# Patient Record
Sex: Female | Born: 1970
Health system: Southern US, Community
[De-identification: ages and names within clinical notes are randomized; demographics above are authoritative.]

## PROBLEM LIST (undated history)

## (undated) DIAGNOSIS — K8020 Calculus of gallbladder without cholecystitis without obstruction: Secondary | ICD-10-CM

## (undated) DIAGNOSIS — E785 Hyperlipidemia, unspecified: Secondary | ICD-10-CM

## (undated) DIAGNOSIS — I10 Essential (primary) hypertension: Secondary | ICD-10-CM

## (undated) DIAGNOSIS — K219 Gastro-esophageal reflux disease without esophagitis: Secondary | ICD-10-CM

## (undated) DIAGNOSIS — J302 Other seasonal allergic rhinitis: Secondary | ICD-10-CM

## (undated) DIAGNOSIS — M1990 Unspecified osteoarthritis, unspecified site: Secondary | ICD-10-CM

## (undated) HISTORY — PX: TONSILLECTOMY: SUR1361

## (undated) HISTORY — PX: TUBAL LIGATION: SHX77

## (undated) HISTORY — DX: Calculus of gallbladder without cholecystitis without obstruction: K80.20

## (undated) HISTORY — DX: Hyperlipidemia, unspecified: E78.5

## (undated) HISTORY — PX: TOTAL ABDOMINAL HYSTERECTOMY: SHX209

## (undated) HISTORY — DX: Essential (primary) hypertension: I10

---

## 1995-03-27 HISTORY — PX: CHOLECYSTECTOMY: SHX55

## 1996-10-08 ENCOUNTER — Encounter: Payer: Self-pay | Admitting: Cardiology

## 1999-09-21 ENCOUNTER — Inpatient Hospital Stay (HOSPITAL_COMMUNITY): Admission: AD | Admit: 1999-09-21 | Discharge: 1999-09-21 | Payer: Self-pay | Admitting: *Deleted

## 2000-09-05 ENCOUNTER — Emergency Department (HOSPITAL_COMMUNITY): Admission: EM | Admit: 2000-09-05 | Discharge: 2000-09-05 | Payer: Self-pay | Admitting: Emergency Medicine

## 2000-09-05 ENCOUNTER — Encounter: Payer: Self-pay | Admitting: Emergency Medicine

## 2000-10-18 ENCOUNTER — Encounter: Payer: Self-pay | Admitting: Obstetrics and Gynecology

## 2000-10-18 ENCOUNTER — Ambulatory Visit (HOSPITAL_COMMUNITY): Admission: RE | Admit: 2000-10-18 | Discharge: 2000-10-18 | Payer: Self-pay | Admitting: Obstetrics and Gynecology

## 2001-03-26 HISTORY — PX: PARTIAL HYMENECTOMY: SHX327

## 2001-06-18 ENCOUNTER — Ambulatory Visit (HOSPITAL_COMMUNITY): Admission: RE | Admit: 2001-06-18 | Discharge: 2001-06-18 | Payer: Self-pay | Admitting: Obstetrics and Gynecology

## 2001-06-18 ENCOUNTER — Encounter: Payer: Self-pay | Admitting: Obstetrics and Gynecology

## 2001-07-17 ENCOUNTER — Inpatient Hospital Stay (HOSPITAL_COMMUNITY): Admission: RE | Admit: 2001-07-17 | Discharge: 2001-07-20 | Payer: Self-pay | Admitting: Obstetrics & Gynecology

## 2003-12-07 ENCOUNTER — Ambulatory Visit (HOSPITAL_COMMUNITY): Admission: RE | Admit: 2003-12-07 | Discharge: 2003-12-07 | Payer: Self-pay | Admitting: Family Medicine

## 2004-04-24 ENCOUNTER — Ambulatory Visit: Payer: Self-pay | Admitting: Family Medicine

## 2004-06-19 ENCOUNTER — Ambulatory Visit: Payer: Self-pay | Admitting: Family Medicine

## 2005-06-04 ENCOUNTER — Ambulatory Visit: Payer: Self-pay | Admitting: Family Medicine

## 2005-08-14 ENCOUNTER — Ambulatory Visit: Payer: Self-pay | Admitting: Family Medicine

## 2005-12-11 ENCOUNTER — Ambulatory Visit: Payer: Self-pay | Admitting: Family Medicine

## 2006-01-08 ENCOUNTER — Ambulatory Visit: Payer: Self-pay | Admitting: Family Medicine

## 2006-01-31 ENCOUNTER — Ambulatory Visit: Payer: Self-pay | Admitting: Physician Assistant

## 2006-03-01 ENCOUNTER — Ambulatory Visit: Payer: Self-pay | Admitting: Family Medicine

## 2006-06-04 ENCOUNTER — Ambulatory Visit: Payer: Self-pay | Admitting: Family Medicine

## 2006-08-15 ENCOUNTER — Ambulatory Visit: Payer: Self-pay | Admitting: Family Medicine

## 2007-03-27 HISTORY — PX: LUMBAR DISC SURGERY: SHX700

## 2007-11-24 ENCOUNTER — Encounter: Admission: RE | Admit: 2007-11-24 | Discharge: 2007-11-24 | Payer: Self-pay | Admitting: Orthopedic Surgery

## 2007-12-19 ENCOUNTER — Ambulatory Visit (HOSPITAL_COMMUNITY): Admission: RE | Admit: 2007-12-19 | Discharge: 2007-12-20 | Payer: Self-pay | Admitting: Neurological Surgery

## 2008-05-05 ENCOUNTER — Encounter: Admission: RE | Admit: 2008-05-05 | Discharge: 2008-05-05 | Payer: Self-pay | Admitting: Neurological Surgery

## 2008-11-04 ENCOUNTER — Emergency Department (HOSPITAL_COMMUNITY): Admission: EM | Admit: 2008-11-04 | Discharge: 2008-11-04 | Payer: Self-pay | Admitting: Emergency Medicine

## 2009-03-26 HISTORY — PX: LUMBAR FUSION: SHX111

## 2009-05-03 ENCOUNTER — Encounter: Payer: Self-pay | Admitting: Cardiology

## 2009-07-07 ENCOUNTER — Encounter: Payer: Self-pay | Admitting: Cardiology

## 2009-08-02 ENCOUNTER — Encounter: Admission: RE | Admit: 2009-08-02 | Discharge: 2009-08-02 | Payer: Self-pay | Admitting: Neurological Surgery

## 2009-08-03 ENCOUNTER — Encounter (INDEPENDENT_AMBULATORY_CARE_PROVIDER_SITE_OTHER): Payer: Self-pay | Admitting: *Deleted

## 2009-08-03 ENCOUNTER — Ambulatory Visit: Payer: Self-pay | Admitting: Cardiology

## 2009-08-03 DIAGNOSIS — I10 Essential (primary) hypertension: Secondary | ICD-10-CM

## 2009-08-03 DIAGNOSIS — E669 Obesity, unspecified: Secondary | ICD-10-CM | POA: Insufficient documentation

## 2009-08-03 DIAGNOSIS — R072 Precordial pain: Secondary | ICD-10-CM | POA: Insufficient documentation

## 2009-08-03 DIAGNOSIS — E785 Hyperlipidemia, unspecified: Secondary | ICD-10-CM

## 2009-08-10 ENCOUNTER — Encounter: Payer: Self-pay | Admitting: Cardiology

## 2009-08-10 ENCOUNTER — Ambulatory Visit: Payer: Self-pay | Admitting: Cardiology

## 2009-08-17 ENCOUNTER — Encounter (INDEPENDENT_AMBULATORY_CARE_PROVIDER_SITE_OTHER): Payer: Self-pay | Admitting: *Deleted

## 2009-12-08 ENCOUNTER — Inpatient Hospital Stay (HOSPITAL_COMMUNITY): Admission: RE | Admit: 2009-12-08 | Discharge: 2009-12-10 | Payer: Self-pay | Admitting: Neurological Surgery

## 2009-12-08 ENCOUNTER — Ambulatory Visit: Payer: Self-pay | Admitting: Vascular Surgery

## 2010-01-10 ENCOUNTER — Encounter: Admission: RE | Admit: 2010-01-10 | Discharge: 2010-01-10 | Payer: Self-pay | Admitting: Neurological Surgery

## 2010-03-14 ENCOUNTER — Encounter
Admission: RE | Admit: 2010-03-14 | Discharge: 2010-03-14 | Payer: Self-pay | Source: Home / Self Care | Attending: Neurological Surgery | Admitting: Neurological Surgery

## 2010-04-14 ENCOUNTER — Encounter
Admission: RE | Admit: 2010-04-14 | Discharge: 2010-04-14 | Payer: Self-pay | Source: Home / Self Care | Attending: Neurological Surgery | Admitting: Neurological Surgery

## 2010-04-27 NOTE — Medication Information (Signed)
Summary: RX Folder/ MED LIST PRIMARY CARE  RX Folder/ MED LIST PRIMARY CARE   Imported By: Dorise Hiss 07/19/2009 10:51:06  _____________________________________________________________________  External Attachment:    Type:   Image     Comment:   External Document

## 2010-04-27 NOTE — Letter (Signed)
Summary: Graded Exercise Tolerance Test  [facility] [address]   [place], [place] [address]   Phone: [phone]  Fax: [phone]      [facility] Cardiovascular Services  Graded Exercise Tolerance Test    [facility]  Appointment Date:_  Appointment Time:_   Your doctor has ordered a stress test to help determine the condition of your  heart during exercise. You may take your medications the day of your test. You may eat a light meal before your test.  Please be sure to bring the copy of your order with you.   You should dress comfortably, for example: Sweat pants, shorts, or skirt, Loose                                                                                                       short sleeved T-shirt, Rubber soled lace-up shoes (tennis shoes)  You will need to arrive 15 minutes before your appointment time. You will also need to enter at the Main Entrance of the hospital and go to the registration desk. They will direct you to the Cardiovascular Department on the third floor.  You will need to plan on being at the hospital for one hour from registration for this appointment.

## 2010-04-27 NOTE — Letter (Signed)
Summary: External Correspondence/ [facility]  External Correspondence/ [facility]   Imported By: Dorise Hiss 07/19/2009 10:45:53  _____________________________________________________________________  External Attachment:    Type:   Image     Comment:   External Document

## 2010-04-27 NOTE — Letter (Signed)
Summary: Engineer, materials at [facility]  [address]   [place], [place] [address]   Phone: [phone]  Fax: [phone]        Aug 17, 2009 MRN: [id number]    [facility] [address] [place], [place]  [address]    Dear Ms. Marga Hoots,  Your test ordered by Selena Batten has been reviewed by your physician (or physician assistant) and was found to be normal or stable. Your physician (or physician assistant) felt no changes were needed at this time.  ____ Echocardiogram  __X__ Cardiac Stress Test-No further cardiac workup needed  ____ Lab Work  ____ Peripheral vascular study of arms, legs or neck  ____ CT scan or X-ray  ____ Lung or Breathing test  ____ Other:   Thank you.   Cyril Loosen, RN, BSN    Duane Boston, M.D., F.A.C.C. Thressa Sheller, M.D., F.A.C.C. Oneal Grout, M.D., F.A.C.C. Cheree Ditto, M.D., F.A.C.C. Daiva Nakayama, M.D., F.A.C.C. Kenney Houseman, M.D., F.A.C.C. Jeanne Ivan, PA-C

## 2010-04-27 NOTE — Assessment & Plan Note (Signed)
Summary: NP6- CHEST PAIN & FAMILY HX CAD   Visit Type:  Initial Consult Primary Provider:  Dr. Joette Catching  CC:  chest pain.  History of Present Illness: The patient presents for evaluation of chest discomfort. She said this happened about 3-4 weeks ago. Was an episode while driving of a left upper chest discomfort. Is also mid sternal is well. She developed tingling and numbness in her fifth and fourth fingers. It lasted for about 20 minutes. It was moderately severe. It did not radiate to her jaw. She had no return of this and has had none since then. 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 She gets fatigued with this. However, she has not been on any chest discomfort, neck or arm discomfort. She doesn't report palpitations, presyncope or syncope. She denies any PND or orthopnea. She has some mild lower extremity swelling. She has had an echocardiogram in the distant past to look for pericardial effusion. This was done in 1998 and I reviewed this and it was normal. She has otherwise had no further testing.  Preventive Screening-Counseling & Management  Alcohol-Tobacco     Smoking Status: quit  Comments: Pt quit smoking 05/2009, smoked for 20 yrs  Current Medications (verified): 1)  Amlodipine Besylate 5 Mg Tabs (Amlodipine Besylate) .... Take 1 Tablet By Mouth Once A Day 2)  Aspirin 81 Mg Tbec (Aspirin) .... Take One Tablet By Mouth Daily 3)  Amitriptyline Hcl 75 Mg Tabs (Amitriptyline Hcl) .... Take 1 Tab By Mouth At Bedtime 4)  Hydrocodone-Acetaminophen 5-500 Mg Tabs (Hydrocodone-Acetaminophen) .... Take 1 Tab By Mouth At Bedtime  Allergies (verified): No Known Drug Allergies  Comments:  Nurse/Medical Assistant: The patient's medications were reviewed with the patient and were updated in the Medication List. Pt brought a list of medications to office visit.  Cyril Loosen, RN, BSN (Aug 03, 2009 3:03 PM)  Past History:  Past Medical History: HYPERTENSION   PRECORDIAL PAIN  Past Surgical History: C-Section Cholecystectomy Microdiscectomy  Family History: Father alive MI x 2 at 70s Mother alive no  CAD Sister with "heart disease"  Social History: Alcohol Use - no Drug Use - no Married, 2 chldren Tobacco Quit 2011, 1/2 ppd for 20 years Smoking Status:  quit  Review of Systems       Mild chronic low back pain. Otherwise as stated in the history of present illness negative for all other systems.  Vital Signs:  Patient profile:   40 year old female Height:      68 inches Weight:      206.50 pounds BMI:     31.51 O2 Sat:      99 % on Room air Pulse rate:   103 / minute BP sitting:   134 / 91  (left arm) Cuff size:   regular  Vitals Entered By: Cyril Loosen, RN, BSN (Aug 03, 2009 2:59 PM)  Nutrition Counseling: Patient's BMI is greater than 25 and therefore counseled on weight management options.  O2 Flow:  Room air CC: chest pain Comments Pt states she has had 1 episode of chest pain. She has family history of CAD   Physical Exam  General:  Well developed, well nourished, in no acute distress. Head:  normocephalic and atraumatic Eyes:  PERRLA/EOM intact; conjunctiva and lids normal. Mouth:  Teeth, gums and palate normal. Oral mucosa normal. Neck:  Neck supple, no JVD. No masses, thyromegaly or abnormal cervical nodes. Chest Wall:  no deformities or breast masses noted  Lungs:  Clear bilaterally to auscultation and percussion. Abdomen:  Bowel sounds positive; abdomen soft and non-tender without masses, organomegaly, or hernias noted. No hepatosplenomegaly. Msk:  Back normal, normal gait. Muscle strength and tone normal. Extremities:  No clubbing or cyanosis. Neurologic:  Alert and oriented x 3. Skin:  Intact without lesions or rashes. Cervical Nodes:  no significant adenopathy Inguinal Nodes:  no significant adenopathy Psych:  Normal affect.   Detailed Cardiovascular Exam  Neck    Carotids: Carotids full and  equal bilaterally without bruits.      Neck Veins: Normal, no JVD.    Heart    Inspection: no deformities or lifts noted.      Palpation: normal PMI with no thrills palpable.      Auscultation: regular rate and rhythm, S1, S2 without murmurs, rubs, gallops, or clicks.    Vascular    Abdominal Aorta: no palpable masses, pulsations, or audible bruits.      Femoral Pulses: normal femoral pulses bilaterally.      Pedal Pulses: normal pedal pulses bilaterally.      Radial Pulses: normal radial pulses bilaterally.      Peripheral Circulation: no clubbing, cyanosis, or edema noted with normal capillary refill.     EKG  Procedure date:  07/07/2009  Findings:      Sinus rhythm, rate 103, axis within normal limits, intervals within normal limits, no acute ST-T wave changes.  Impression & Recommendations:  Problem # 1:  PRECORDIAL PAIN (ICD-786.51) Her chest pain is atypical. However, she has a significant family history. I will order an exercise treadmill. Orders: GXT (GXT)  Problem # 2:  HYPERTENSION (ICD-401.9) Her blood pressure is controlled and she will continue the meds as listed.  Problem # 3:  OBESITY, UNSPECIFIED (ICD-278.00) I had a long discussion with her about weight loss and exercise. I gave her specific instructions on both.  Problem # 4:  DYSLIPIDEMIA (ICD-272.4) I reviewed with her recent lipids which included an LDL of 136, HDL 42 and triglycerides 208. I suggested diet first and a statin if she cannot improve on this.  Patient Instructions: 1)  Your physician recommends that you continue on your current medications as directed. Please refer to the Current Medication list given to you today. 2)  Your physician has requested that you have an exercise tolerance test.  For further information please visit [email or web address]  Please also follow instruction sheet, as given.

## 2010-06-08 LAB — CBC
HCT: 43.3 % (ref 36.0–46.0)
Hemoglobin: 15.1 g/dL — ABNORMAL HIGH (ref 12.0–15.0)
MCH: 32.1 pg (ref 26.0–34.0)
MCHC: 34.9 g/dL (ref 30.0–36.0)
MCV: 92.1 fL (ref 78.0–100.0)
RBC: 4.7 MIL/uL (ref 3.87–5.11)

## 2010-06-08 LAB — APTT: aPTT: 26 seconds (ref 24–37)

## 2010-06-08 LAB — DIFFERENTIAL
Basophils Relative: 0 % (ref 0–1)
Monocytes Absolute: 0.4 10*3/uL (ref 0.1–1.0)
Neutrophils Relative %: 65 % (ref 43–77)

## 2010-06-08 LAB — BASIC METABOLIC PANEL
Calcium: 9.6 mg/dL (ref 8.4–10.5)
Chloride: 104 mEq/L (ref 96–112)
Glucose, Bld: 99 mg/dL (ref 70–99)
Sodium: 136 mEq/L (ref 135–145)

## 2010-06-08 LAB — PROTIME-INR: INR: 0.9 (ref 0.00–1.49)

## 2010-06-12 ENCOUNTER — Ambulatory Visit
Admission: RE | Admit: 2010-06-12 | Discharge: 2010-06-12 | Disposition: A | Discharge status: Home / Self Care | Payer: BC Managed Care – PPO | Source: Ambulatory Visit | Attending: Neurological Surgery | Admitting: Neurological Surgery

## 2010-06-12 ENCOUNTER — Other Ambulatory Visit: Payer: Self-pay | Admitting: Neurological Surgery

## 2010-06-12 DIAGNOSIS — Z09 Encounter for follow-up examination after completed treatment for conditions other than malignant neoplasm: Secondary | ICD-10-CM

## 2010-07-01 LAB — POCT I-STAT, CHEM 8
BUN: 7 mg/dL (ref 6–23)
Calcium, Ion: 1.13 mmol/L (ref 1.12–1.32)
Chloride: 109 mEq/L (ref 96–112)
Glucose, Bld: 90 mg/dL (ref 70–99)
TCO2: 21 mmol/L (ref 0–100)

## 2010-07-01 LAB — DIFFERENTIAL
Basophils Absolute: 0 10*3/uL (ref 0.0–0.1)
Basophils Relative: 1 % (ref 0–1)
Eosinophils Absolute: 0.1 10*3/uL (ref 0.0–0.7)
Monocytes Absolute: 0.3 10*3/uL (ref 0.1–1.0)
Monocytes Relative: 6 % (ref 3–12)
Neutro Abs: 2.7 10*3/uL (ref 1.7–7.7)
Neutrophils Relative %: 55 % (ref 43–77)

## 2010-07-01 LAB — POCT CARDIAC MARKERS
CKMB, poc: <1 ng/mL — ABNORMAL LOW (ref 1.0–8.0)
Myoglobin, poc: 74 ng/mL (ref 12–200)
Troponin i, poc: <0.05 ng/mL (ref 0.00–0.09)
Troponin i, poc: <0.05 ng/mL (ref 0.00–0.09)

## 2010-07-01 LAB — CBC
MCHC: 34.4 g/dL (ref 30.0–36.0)
MCV: 92.3 fL (ref 78.0–100.0)
RDW: 13.6 % (ref 11.5–15.5)

## 2010-07-01 LAB — PROTIME-INR: INR: 1 (ref 0.00–1.49)

## 2010-07-01 LAB — BRAIN NATRIURETIC PEPTIDE: Pro B Natriuretic peptide (BNP): <30 pg/mL (ref 0.0–100.0)

## 2010-07-01 LAB — APTT: aPTT: 30 seconds (ref 24–37)

## 2010-07-25 ENCOUNTER — Other Ambulatory Visit: Payer: Self-pay | Admitting: Neurological Surgery

## 2010-07-25 ENCOUNTER — Ambulatory Visit
Admission: RE | Admit: 2010-07-25 | Discharge: 2010-07-25 | Disposition: A | Discharge status: Home / Self Care | Payer: BC Managed Care – PPO | Source: Ambulatory Visit | Attending: Neurological Surgery | Admitting: Neurological Surgery

## 2010-07-25 DIAGNOSIS — IMO0002 Reserved for concepts with insufficient information to code with codable children: Secondary | ICD-10-CM

## 2010-07-25 DIAGNOSIS — M5137 Other intervertebral disc degeneration, lumbosacral region: Secondary | ICD-10-CM

## 2010-08-08 NOTE — Op Note (Signed)
[name], Clay                ACCOUNT NO.:  [id number]   MEDICAL RECORD NO.:  [id number]          PATIENT TYPE:  OIB   LOCATION:  3599                         FACILITY:  [facility]   PHYSICIAN:  Tia Alert, MD     DATE OF BIRTH:  06-17-1970   DATE OF PROCEDURE:  12/19/2007  DATE OF DISCHARGE:                               OPERATIVE REPORT   PREOPERATIVE DIAGNOSIS:  Lumbar disk herniation L5-S1 on the left with  left S1 radiculopathy.   POSTOPERATIVE DIAGNOSIS:  Lumbar disk herniation L5-S1 on the left with  left S1 radiculopathy.   PROCEDURES:  Lumbar hemilaminectomy, medial facetectomy and foraminotomy  at L5-S1 on the left, followed by microdiscectomy __________ utilizing  microscopic dissection.   SURGEON:  Tia Alert, MD   ASSISTANT:  __________.   ANESTHESIA:  General endotracheal.   COMPLICATIONS:  None apparent.   INDICATION FOR THE PROCEDURE:  [name] is a 40 year old female who  presented with severe left leg pain in the bilateral S1 distribution.  She had an MRI, which showed a small disk herniation L5-S1 on the left  with a free fragment extending caudally up under the S1 nerve root.  I  recommended a microdiscectomy at L5-S1 on the left in hopes of improving  her pain syndrome.  She had tried medical management for quite some time  without significant relief.  She understood the risks, benefits, and  expected outcome and wished to proceed.   DESCRIPTION OF THE PROCEDURE:  The patient was taken to the operating  room.  After induction of adequate generalized endotracheal anesthesia,  she was rolled into the prone position on the Wilson frame and all  pressure points were padded.  Her lumbar region was prepped with  DuraPrep, and then draped in the usual sterile fashion.  A 5 mL of local  anesthesia was injected.  A small dorsal midline incision was made and  carried down to the lumbosacral fascia.  The fascia was opened up on the  patient's left side,  and the paraspinous musculature was taken down in a  subperiosteal fashion to expose the L5-S1 on the left.  Intraoperative x-  ray confirmed that level, and then used the combination of the high-  speed drill and Kerrison punches to perform the hemilaminectomy, medial  facetectomy, and foraminotomy at L5-S1 on the left side.   The underlying yellow ligament was opened and removed in a piecemeal  fashion to expose the underlying dura and S1 nerve root.  The S1 nerve  root was retracted medially, and the epidural venous vasculature was  coagulated and opened.  This exposed a small free fragment sitting up  under the left S1 nerve root just caudal to the disk space.  This was  removed with a nerve hook and pituitary rongeurs.  I then incised the  disk space and performed a thorough intradiscal discectomy.  Once this  was done, I palpated with a nerve hook into the midline and into the  foramen, which showed adequate decompression of the nerve root, was free  and pulsatile, irrigated with  saline solution containing Bacitracin  allowing the DuraGen to help prevent epidural fibrosis, and then closed  the fascia with 0 Vicryl, closed the subcutaneous and subcuticular  tissue with 2-0 and 3-0 Vicryl, and closed the skin with Benzoin  and Steri-Strips.  The drapes were removed.  A sterile dressing was  applied.  The patient was awakened from general anesthesia, and  transported to the recovery room in stable condition.  At the end of the  procedure, all sponge, needle, and instrument counts were correct.      Tia Alert, MD  Electronically Signed     DSJ/MEDQ  D:  12/19/2007  T:  12/20/2007  Job:  [id number]

## 2010-08-11 NOTE — Op Note (Signed)
[facility]  Patient:    Jamie Clay, Jamie Clay Visit Number: [id number] MRN: [id number]          Service Type: MED Location: 4A A427 [id number] Attending Physician:  Lazaro Arms Dictated by:   Turner Daniels, M.D. Proc. Date: 07/17/01 Admit Date:  07/17/2001                             Operative Report  PREOPERATIVE DIAGNOSES: 1. Dysmenorrhea, severe. 2. Hypermenorrhea. 3. Nausea, vomiting, diarrhea, back and leg pain associated with menses. 4. Bicornuate uterus.  POSTOPERATIVE DIAGNOSES: 1. Dysmenorrhea, severe. 2. Hypermenorrhea. 3. Nausea, vomiting, diarrhea, back and leg pain associated with menses. 4. Bicornuate uterus. 5. Minimal endometriosis.  PROCEDURES: 1. Abdominal hysterectomy. 2. Peritoneal biopsy of endometriosis and ablation of endometriosis.  SURGEON: Turner Daniels, M.D.  ANESTHESIA: General endotracheal.  FINDINGS: The patient had an obvious bicornuate uterus.  Both ovaries and tubes appeared to be normal.  There was no endometriosis on the ovaries. There was a very small simple cyst on the left ovary.  She had stigmata of ovulation on that ovary.  There was some just gunpowder lesions of the right posterior cul-de-sac, which were excised sharply and sent to pathology for evaluation.  There was some on the left, and all of these were ablated with electrocautery unit. The upper abdomen, liver, gallbladder, and kidneys were all normal. There were no other peritoneal abnormalities.  DESCRIPTION OF OPERATION: The patient was taken to the operating room and placed in a supine position where she underwent general endotracheal anesthesia.  She was prepped and draped in the usual sterile fashion.  A Foley catheter was placed, and her vagina was prepped.  A Pfannenstiel skin incision was made and carried down sharply through the rectus fascia which was scored in the midline and extended laterally.  The fascia was taken off the muscle superiorly and  inferiorly without difficulty. The muscles were divided, and the peritoneal cavity was entered.  A Balfour self-retaining retractor was placed, and the upper abdomen was packed away, and the patient was placed in Trendelenburg position.  The uterine cornu were grasped, and the left round ligament was suture ligated and cut. The vesicouterine serosal flap on the left was created.  An avascular window in the utero-ovarian ligament was made, and it was clamped, cut, and double suture ligated with good hemostasis. The uterine vessels on the left were skeletonized.  The bladder flap was created on the right, and the right round ligament was suture ligated and cut.  An avascular window was identified in the utero-ovarian ligament on the right. It was clamped, cut, and double suture ligated with good hemostasis.  The right uterine vessels were skeletonized. The bladder was pushed off the lower uterine segment bluntly.  Both uterine vessels were clamped, cut, and suture ligated with good hemostasis.  A Heaney clamp was used down the cervix through the cardinal ligament, each pedicle being clamped, cut, and transfixion suture ligated.  I reached the vagina.  The vagina was cross clamped.  The vagina was incised, and the specimen was removed. Vaginal angle sutures were placed bilaterally, and a single figure-of-eight suture was placed for vaginal closure and hemostasis.  The pelvis was irrigated using warm normal saline. The endometriosis in the uterosacral ligaments were identified.  A peritoneal biopsy was taken on the right just to confirm this was ablated, as well as the lesions in the left uterosacral.  There  was minimal endometriosis.  There was no other peritoneal or ovarian endometriosis seen.  A culdoplasty suture was placed by plicating the uterosacral ligaments, and the cardinal ligament sutures were tied together for additional vaginal bladder support and bladder support.  Again, the  pelvis was irrigated, and all pedicles were found to be hemostatic.  The ovaries were attached to the round ligaments bilaterally with 3-0 Vicryl.  The Balfour self-retaining retractor was removed.  The packs were removed.  The subcutaneous tissue and the muscles were irrigated and made hemostatic.  The muscles were reapproximated loosely using 0 chromic.  The fascia was closed using 0 Vicryl running.  The subcutaneous tissue was irrigated and made hemostatic, and the skin was closed using skin staples. The patient tolerated the procedure well.  She experienced 150 cc of blood loss and was taken to the recovery room in a good, stable condition.  All counts were correct x3. Dictated by:   Turner Daniels, M.D. Attending Physician:  Lazaro Arms DD:  07/17/01 TD:  07/17/01 Job: [id number]

## 2010-08-11 NOTE — Discharge Summary (Signed)
[facility]  Patient:    Jamie Clay, Jamie Clay Visit Number: [id number] MRN: [id number]          Service Type: MED Location: 4A [id number] 01 Attending Physician:  Lazaro Arms Dictated by:   Turner Daniels, M.D. Admit Date:  07/17/2001 Discharge Date: 07/20/2001                             Discharge Summary  DISCHARGE DIAGNOSES: 1. Status post abdominal hysterectomy. 2. Unremarkable postoperative course.  PROCEDURE:  Abdominal hysterectomy.  HISTORY OF PRESENT ILLNESS:  Please refer to the transcribed history and physical and the operative note for details of admission to the hospital.  HOSPITAL COURSE:  Patient was admitted postoperatively.  She had an unremarkable course.  She had a hemoglobin and hematocrit on postoperative day #1 of 11.5 and 32.2 and on postoperative day #3 was 10.8 and 31.8 with a white count of 4400.  Her bowel function returned somewhat slowly.  She required Dulcolax suppositories to begin passing gas.  She did not have a bowel movement prior to discharge.  Her abdomen was flat and had normal bowel sounds and was passing flatus.  She also had some bladder spasm that required Urecholine.  She tolerated progression in her diet.  She tolerated ambulation. She did well with oral pain medicine and as a result was discharged to home on the morning of postoperative day #3 in good, stable condition to follow up in the office on Wednesday, April 30 at 10 a.m. to have her staples removed.  She was given a prescription for Tylox, Bextra 20 mg, Phenergan 25 mg, and Urecholine 25 mg half tablet p.r.n. for bladder spasm.  She will be seen back in the office as I said, on Wednesday and she is given instructions/precautions for contacting the office prior to that time. Dictated by:   Turner Daniels, M.D. Attending Physician:  Lazaro Arms DD:  07/20/01 TD:  07/21/01 Job: [id number]

## 2010-08-11 NOTE — H&P (Signed)
[facility]  Patient:    Jamie Clay, Jamie Clay Visit Number: [id number] MRN: [id number]          Service Type: OUT Location: RAD Attending Physician:  Tilda Burrow Dictated by:   Duane Lope, M.D. Admit Date:  06/18/2001 Discharge Date: 06/18/2001                           History and Physical  PREOPERATIVE HISTORY AND PHYSICAL  HISTORY OF PRESENT ILLNESS:  The patient is a 40 year old white female, gravida 2, para 2, status post tubal ligation, currently on her menses, who is admitted for abdominal hysterectomy.  The patient had a bicornuate uterus which is of some interest as it relates to her other symptoms.  The patient has had severe dysmenorrhea and hypermenorrhea really her whole life. Currently in the last several years she has taken oral contraceptive pills and nonsteroidals without any effect.  Currently she takes Darvocet, Phenergan and 800 mg of Motrin 3-4 times a day and it does not help.  She has diarrhea, nausea and vomiting, pain all down her legs and pain in her back and nothing really helps.  She is basically bedridden for several days with her menses. She has been on conservative therapy in the past and, of course, has not responded.  As a result, she is admitted for an abdominal hysterectomy.  PAST MEDICAL HISTORY:  Negative.  PAST SURGICAL HISTORY: 1. Two C-sections with a tubal. 2. Tonsils and adenoids. 3. Laparoscopic cholecystectomy. 4. Laparoscopic lysis of adhesions.  PAST OBSTETRICAL HISTORY:  Two C-sections and two pregnancies.  MEDICINES:  None.  ALLERGIES:  None.  REVIEW OF SYSTEMS:  Noncontributory.  SOCIAL HISTORY:  She smokes a half a pack of cigarettes a day.  PHYSICAL EXAMINATION:  VITAL SIGNS:  Her weight is 157 pounds, blood pressure is 110/80.  HEENT:  Unremarkable, with normal thyroid.  LUNGS:  Clear.  HEART:  Regular rate and rhythm without murmur, regurgitation or gallop.  BREASTS:  Without  mass, discharge, skin changes.  ABDOMEN:  Benign, no hepatosplenomegaly and no masses, nontender, no guarding.  GENITALIA:  She has normal external genitalia.  The vagina is pink and moist without discharge.  Cervix is nulliparous, without lesions.  Uterus is normal size, shape, contour.  No cervical motion tenderness, mildly tender to palpation, on her menses.  Adnexa is negative.  EXTREMITIES:  Warm with no edema.  NEUROLOGICAL EXAM:  Grossly intact.  IMPRESSION: 1. Severe dysmenorrhea, primary. 2. Hypermenorrhea. 3. Bicornuate uterus.  PLAN:  The patient has failed all conservative therapy.  She wants to proceed with definitive management and, as a result, she is admitted for an abdominal hysterectomy.  She understands the risks, benefits, indications, alternatives and will proceed. Dictated by:   Duane Lope, M.D. Attending Physician:  Tilda Burrow DD:  07/16/01 TD:  07/16/01 Job: [id number]

## 2010-11-06 ENCOUNTER — Other Ambulatory Visit: Payer: Self-pay | Admitting: Neurological Surgery

## 2010-11-06 ENCOUNTER — Ambulatory Visit
Admission: RE | Admit: 2010-11-06 | Discharge: 2010-11-06 | Disposition: A | Discharge status: Home / Self Care | Payer: BC Managed Care – PPO | Source: Ambulatory Visit | Attending: Neurological Surgery | Admitting: Neurological Surgery

## 2010-11-06 DIAGNOSIS — M545 Low back pain: Secondary | ICD-10-CM

## 2010-11-06 DIAGNOSIS — M5137 Other intervertebral disc degeneration, lumbosacral region: Secondary | ICD-10-CM

## 2010-12-25 LAB — DIFFERENTIAL
Basophils Absolute: 0
Eosinophils Relative: 2
Lymphocytes Relative: 35
Lymphs Abs: 1.5
Neutro Abs: 2.5
Neutrophils Relative %: 56

## 2010-12-25 LAB — CBC
HCT: 42.3
Platelets: 204
RDW: 13.2
WBC: 4.4

## 2010-12-25 LAB — BASIC METABOLIC PANEL
BUN: 6
Calcium: 9.4
Creatinine, Ser: 1
GFR calc non Af Amer: >60
Glucose, Bld: 99

## 2010-12-25 LAB — PROTIME-INR
INR: 1
Prothrombin Time: 13

## 2011-10-23 ENCOUNTER — Encounter: Payer: Self-pay | Admitting: Cardiology

## 2012-08-13 IMAGING — CR DG CHEST 2V
2 series · 2 of 2 positions shown · non-contrast
Comparison: 11/04/2008

CLINICAL DATA: Lumbar spondylosis. Pre-op respiratory exam.

CHEST - 2 VIEW

[view not recorded (1 of 2)]
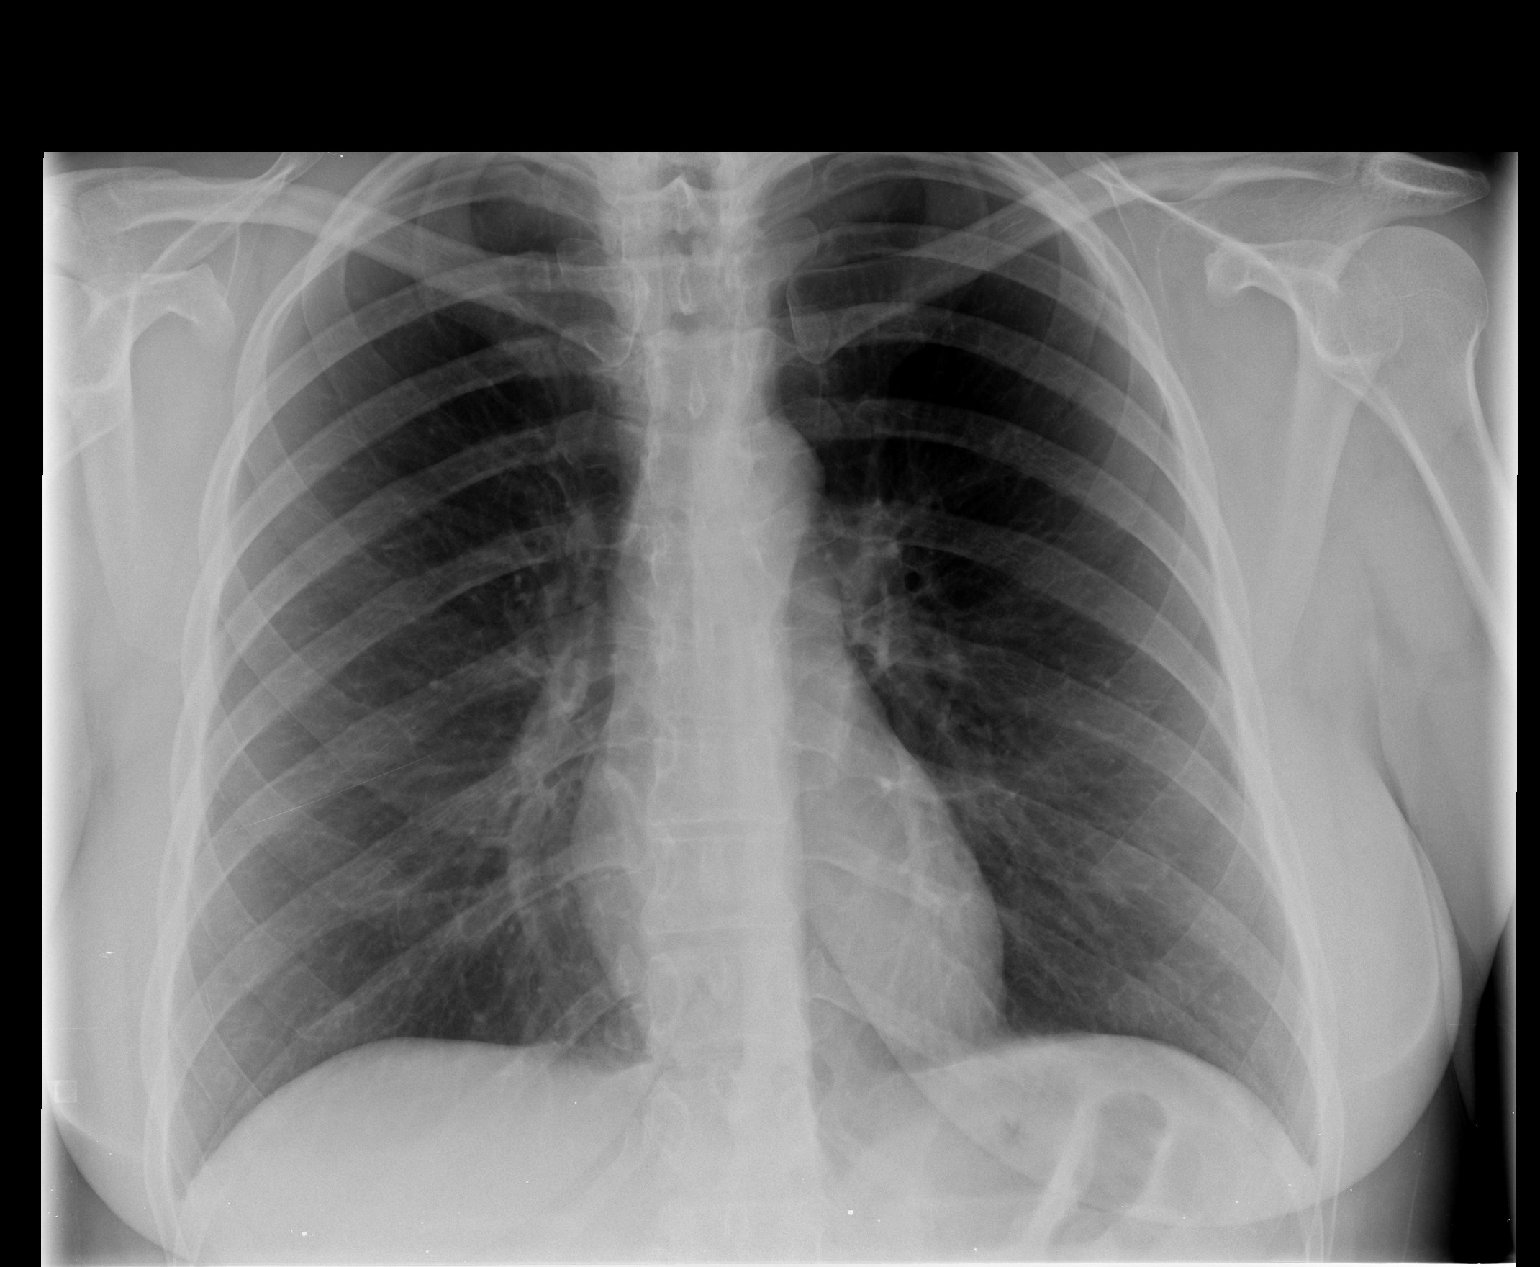

[view not recorded (2 of 2)]
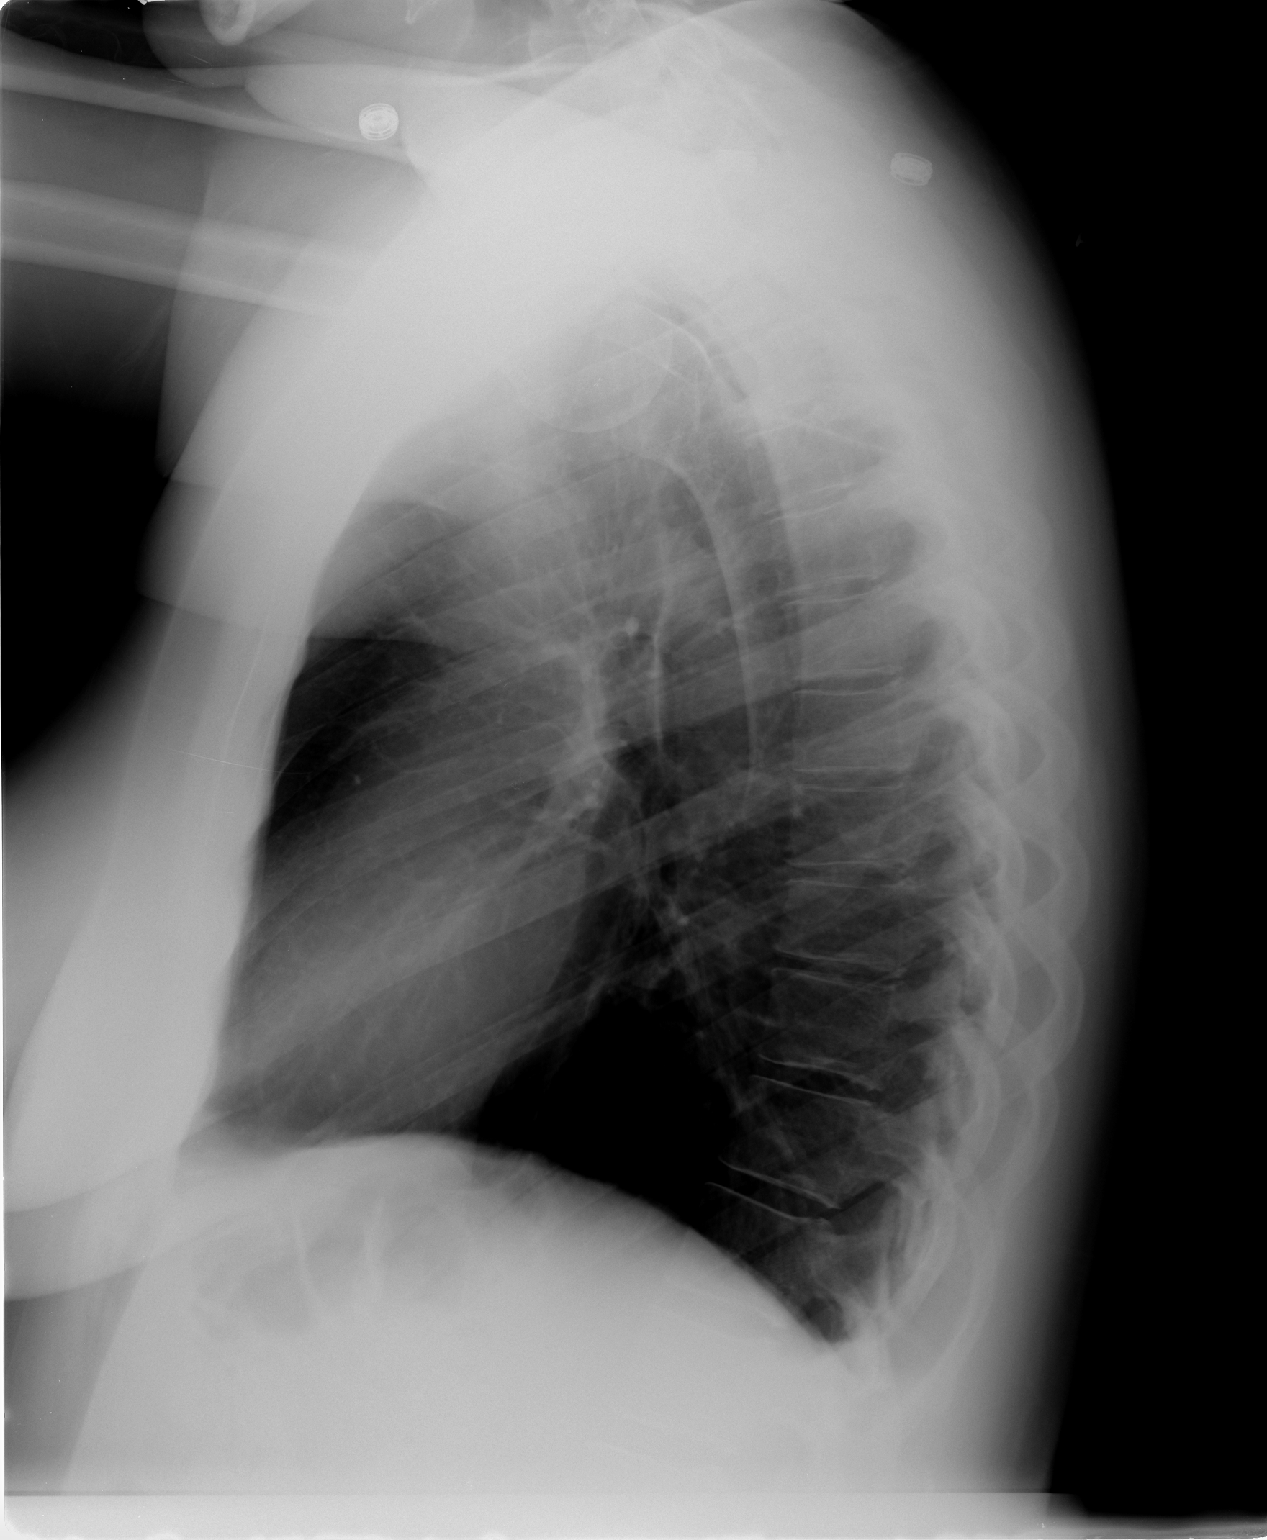

[2 of 2 positions shown; findings below may reference images not displayed]

FINDINGS: The heart size and mediastinal contours are within
normal limits.  Both lungs are clear.  The visualized skeletal
structures are unremarkable.
IMPRESSION: No active cardiopulmonary disease.

## 2013-02-21 IMAGING — CR DG LUMBAR SPINE 2-3V
3 series · 3 of 3 positions shown · non-contrast
Comparison: 03/14/2010 and earlier

CLINICAL DATA: Left leg pain - post lumbar fusion

LUMBAR SPINE - 2-3 VIEW

[t l-spine a.p.]
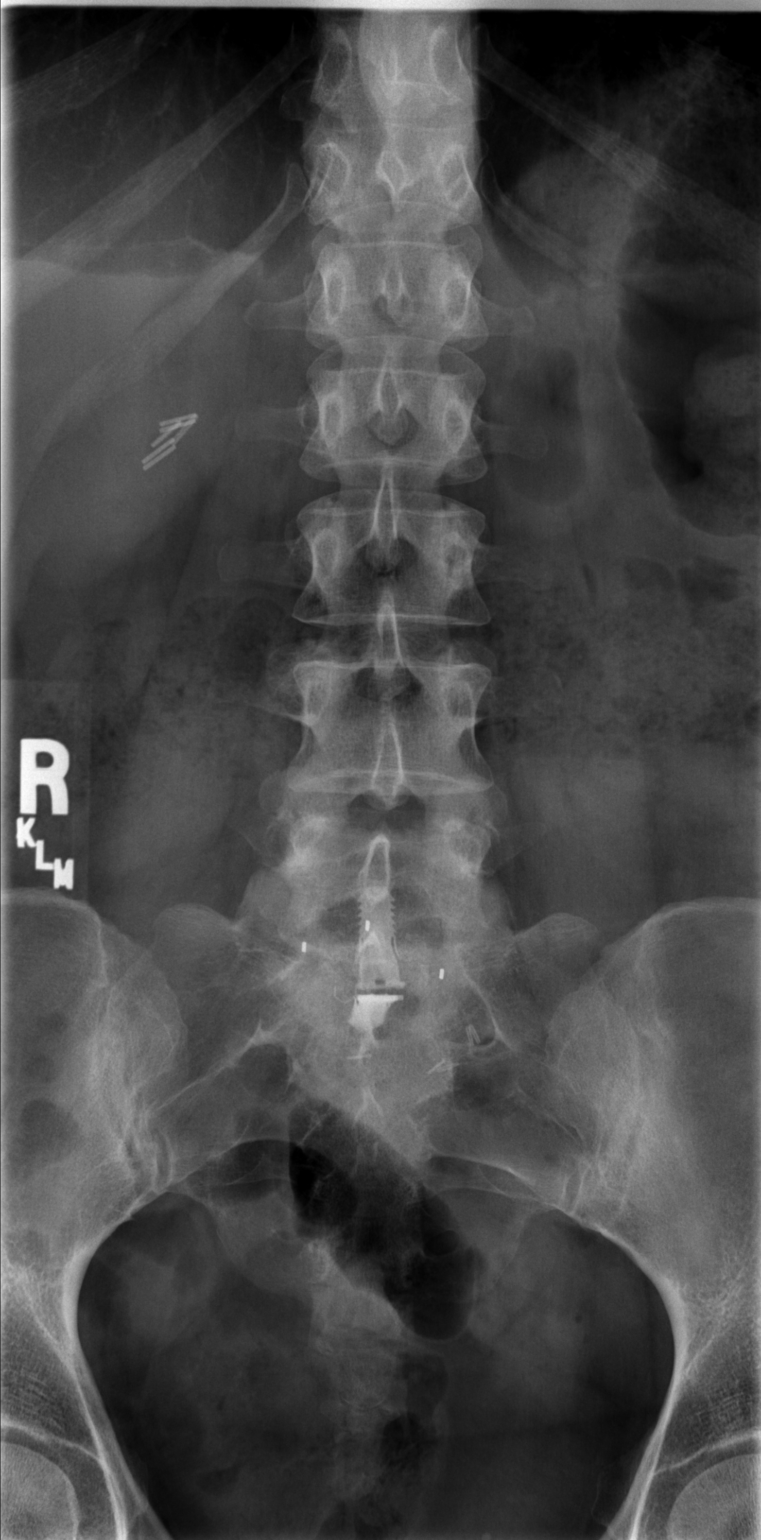

[t l-spine lat]
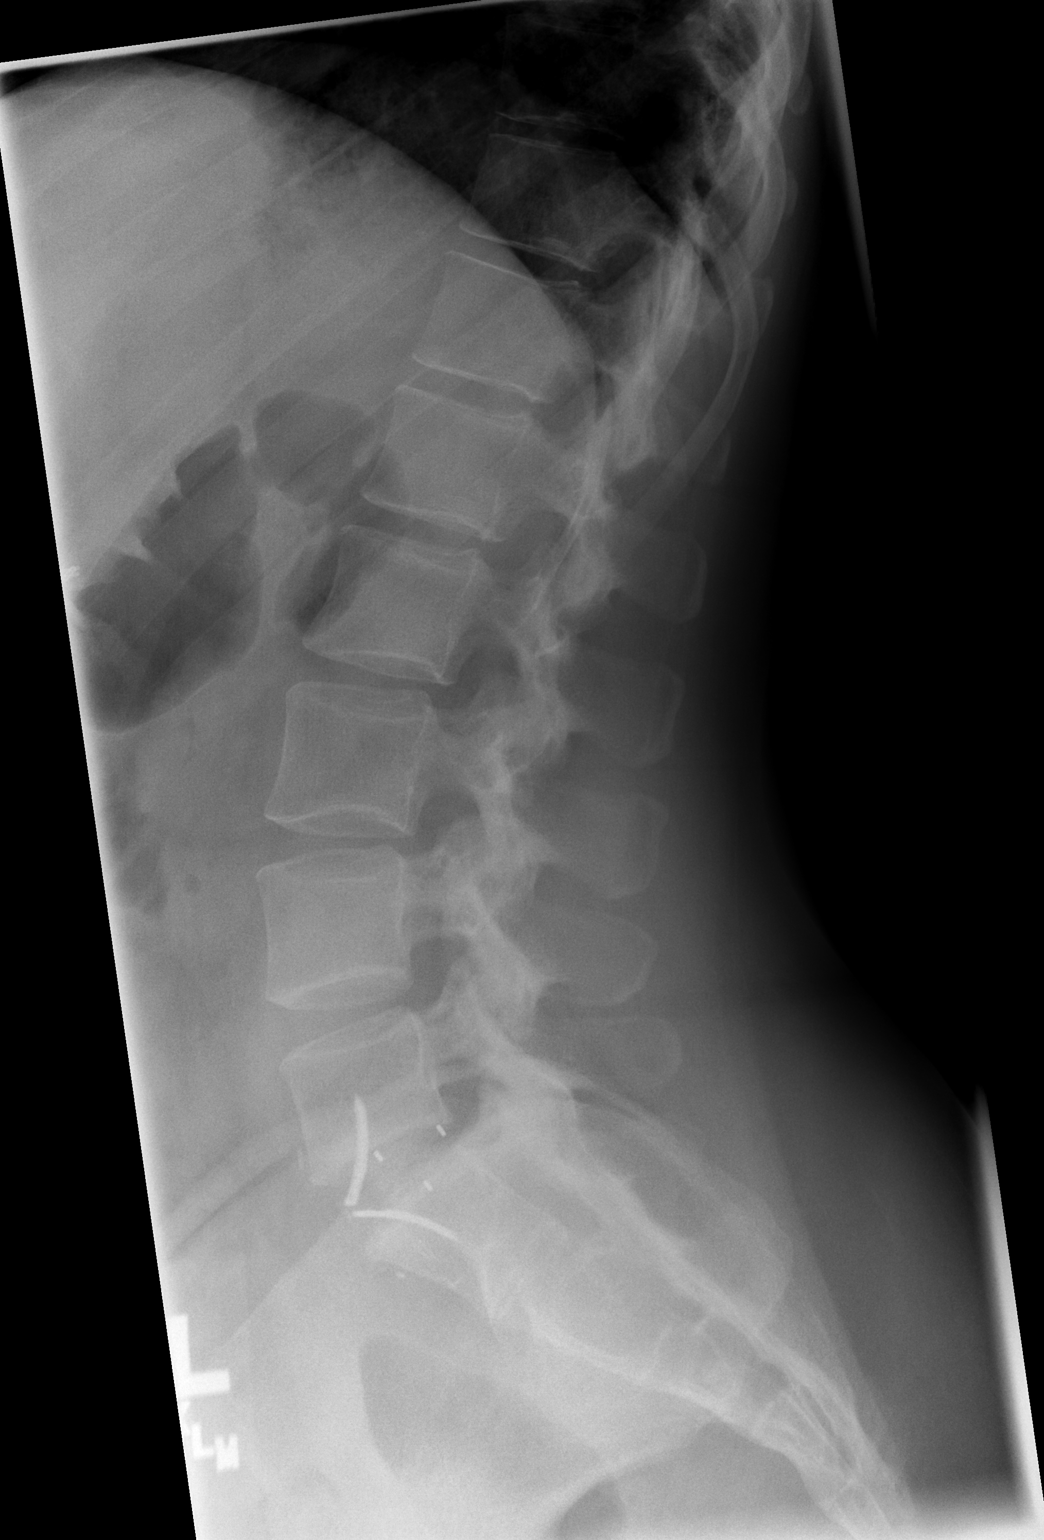

[t l-spine l5-s1 spot]
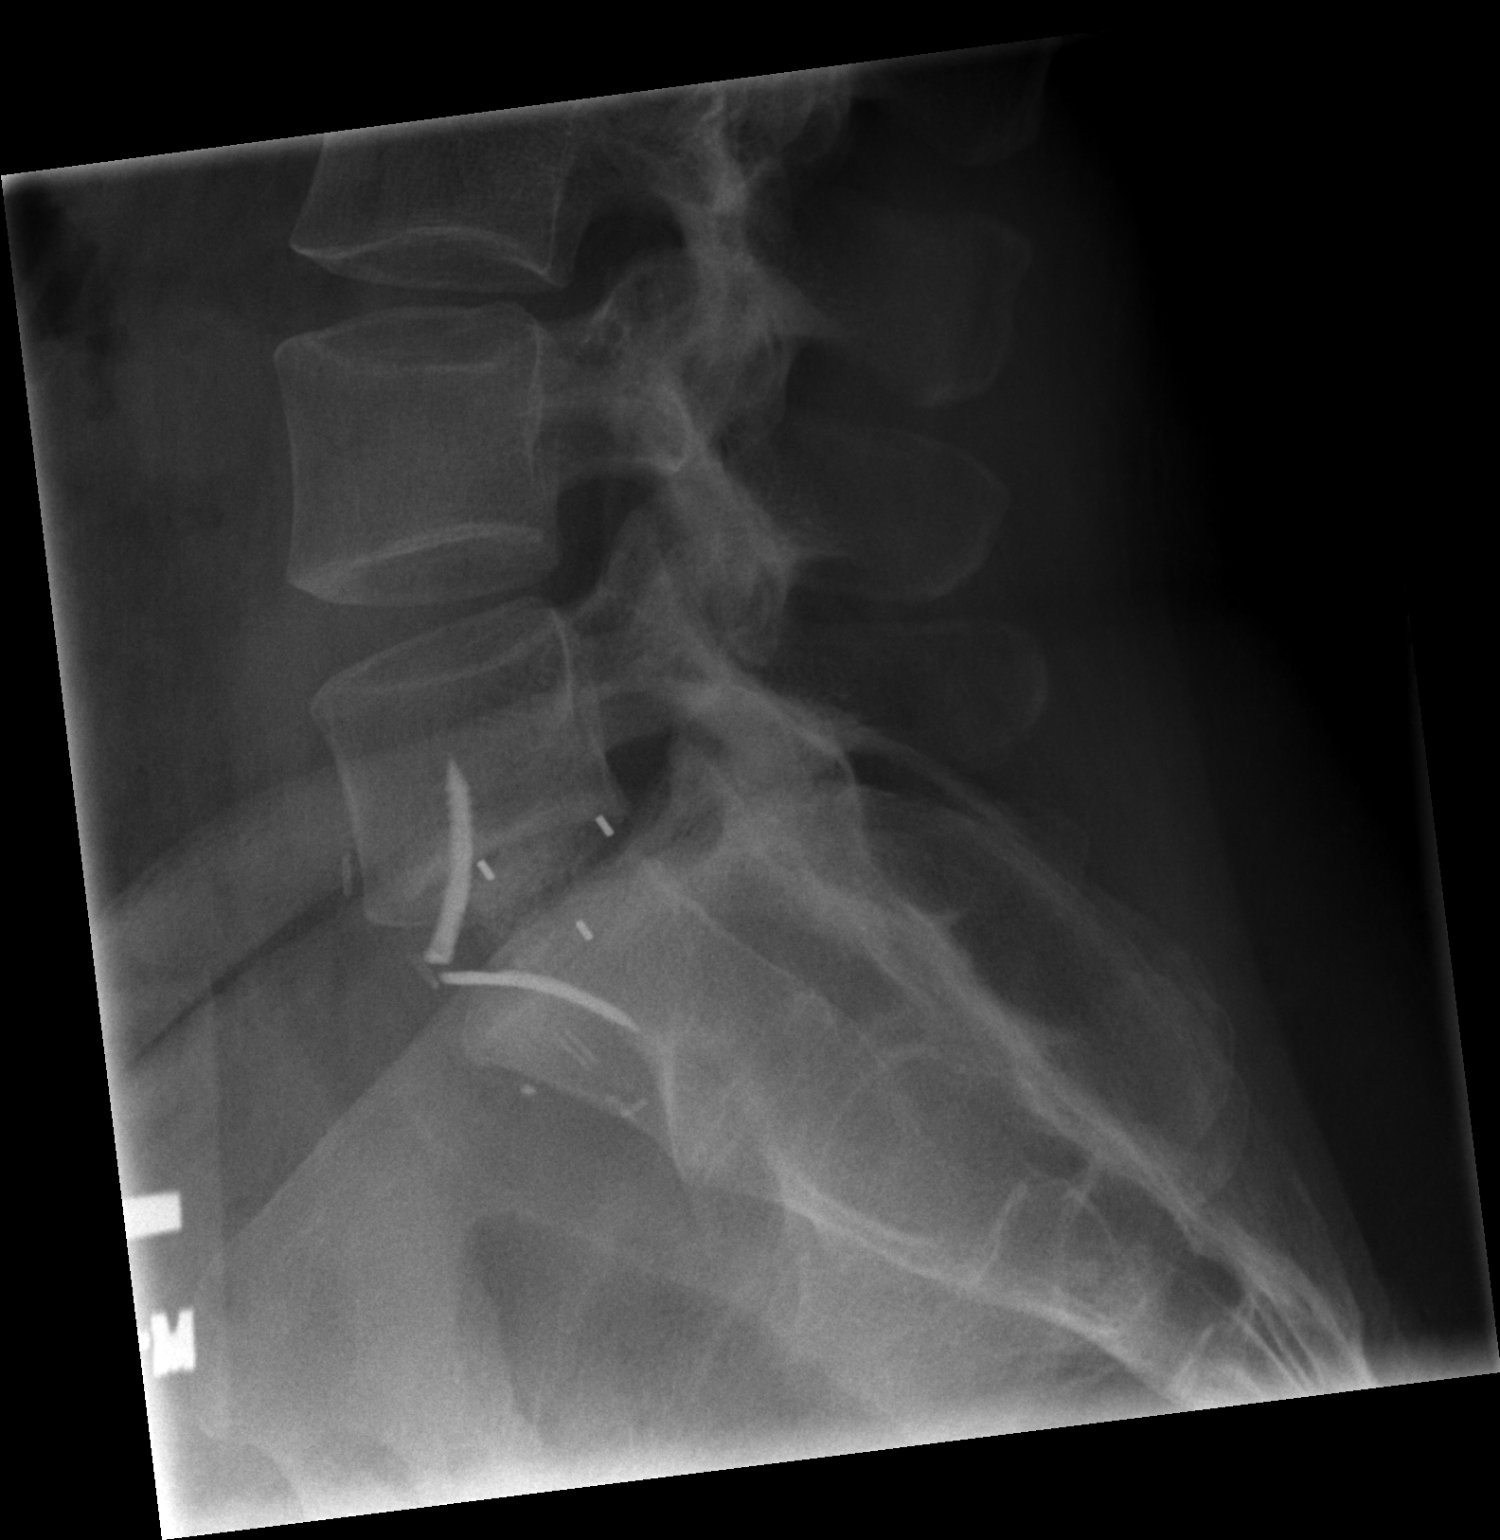

[3 of 3 positions shown; findings below may reference images not displayed]

FINDINGS: Post L5-S1 anterior interbody fusion.  Good position and
alignment of bony elements and hardware.  Disc height at other
levels remains normal.  No other acute or significant findings.
IMPRESSION: Post L5-S1 anterior interbody fusion.  No acute findings or
interval change.

## 2014-04-08 ENCOUNTER — Other Ambulatory Visit: Payer: Self-pay | Admitting: Orthopedic Surgery

## 2014-04-22 ENCOUNTER — Encounter (HOSPITAL_BASED_OUTPATIENT_CLINIC_OR_DEPARTMENT_OTHER): Payer: Self-pay | Admitting: *Deleted

## 2014-04-22 NOTE — Progress Notes (Signed)
To come in for ekg-bmet 

## 2014-04-26 ENCOUNTER — Encounter (HOSPITAL_BASED_OUTPATIENT_CLINIC_OR_DEPARTMENT_OTHER)
Admission: RE | Admit: 2014-04-26 | Discharge: 2014-04-26 | Disposition: A | Discharge status: Home / Self Care | Payer: 59 | Source: Ambulatory Visit | Attending: Orthopedic Surgery | Admitting: Orthopedic Surgery

## 2014-04-26 ENCOUNTER — Other Ambulatory Visit: Payer: Self-pay

## 2014-04-26 DIAGNOSIS — Z9049 Acquired absence of other specified parts of digestive tract: Secondary | ICD-10-CM | POA: Diagnosis not present

## 2014-04-26 DIAGNOSIS — M1811 Unilateral primary osteoarthritis of first carpometacarpal joint, right hand: Secondary | ICD-10-CM | POA: Diagnosis not present

## 2014-04-26 DIAGNOSIS — F17210 Nicotine dependence, cigarettes, uncomplicated: Secondary | ICD-10-CM | POA: Diagnosis not present

## 2014-04-26 DIAGNOSIS — I10 Essential (primary) hypertension: Secondary | ICD-10-CM | POA: Diagnosis not present

## 2014-04-26 DIAGNOSIS — K219 Gastro-esophageal reflux disease without esophagitis: Secondary | ICD-10-CM | POA: Diagnosis not present

## 2014-04-26 DIAGNOSIS — Z90710 Acquired absence of both cervix and uterus: Secondary | ICD-10-CM | POA: Diagnosis not present

## 2014-04-26 DIAGNOSIS — Z9851 Tubal ligation status: Secondary | ICD-10-CM | POA: Diagnosis not present

## 2014-04-26 LAB — BASIC METABOLIC PANEL
Anion gap: 6 (ref 5–15)
BUN: 9 mg/dL (ref 6–23)
CALCIUM: 8.8 mg/dL (ref 8.4–10.5)
CO2: 25 mmol/L (ref 19–32)
CREATININE: 0.96 mg/dL (ref 0.50–1.10)
Chloride: 106 mmol/L (ref 96–112)
GFR calc non Af Amer: 71 mL/min — ABNORMAL LOW (ref >90)
GFR, EST AFRICAN AMERICAN: 82 mL/min — AB (ref >90)
GLUCOSE: 102 mg/dL — AB (ref 70–99)
POTASSIUM: 3.8 mmol/L (ref 3.5–5.1)
SODIUM: 137 mmol/L (ref 135–145)

## 2014-04-27 ENCOUNTER — Ambulatory Visit (HOSPITAL_BASED_OUTPATIENT_CLINIC_OR_DEPARTMENT_OTHER)
Admission: RE | Admit: 2014-04-27 | Discharge: 2014-04-27 | Disposition: A | Discharge status: Home / Self Care | Payer: 59 | Source: Ambulatory Visit | Attending: Orthopedic Surgery | Admitting: Orthopedic Surgery

## 2014-04-27 ENCOUNTER — Ambulatory Visit (HOSPITAL_BASED_OUTPATIENT_CLINIC_OR_DEPARTMENT_OTHER): Payer: 59 | Admitting: Anesthesiology

## 2014-04-27 ENCOUNTER — Encounter (HOSPITAL_BASED_OUTPATIENT_CLINIC_OR_DEPARTMENT_OTHER)
Admission: RE | Disposition: A | Discharge status: Home / Self Care | Payer: 59 | Source: Ambulatory Visit | Attending: Orthopedic Surgery

## 2014-04-27 ENCOUNTER — Encounter (HOSPITAL_BASED_OUTPATIENT_CLINIC_OR_DEPARTMENT_OTHER): Payer: Self-pay

## 2014-04-27 DIAGNOSIS — M1811 Unilateral primary osteoarthritis of first carpometacarpal joint, right hand: Secondary | ICD-10-CM | POA: Insufficient documentation

## 2014-04-27 DIAGNOSIS — F17210 Nicotine dependence, cigarettes, uncomplicated: Secondary | ICD-10-CM | POA: Insufficient documentation

## 2014-04-27 DIAGNOSIS — Z9851 Tubal ligation status: Secondary | ICD-10-CM | POA: Insufficient documentation

## 2014-04-27 DIAGNOSIS — I10 Essential (primary) hypertension: Secondary | ICD-10-CM | POA: Insufficient documentation

## 2014-04-27 DIAGNOSIS — K219 Gastro-esophageal reflux disease without esophagitis: Secondary | ICD-10-CM | POA: Insufficient documentation

## 2014-04-27 DIAGNOSIS — Z90710 Acquired absence of both cervix and uterus: Secondary | ICD-10-CM | POA: Insufficient documentation

## 2014-04-27 DIAGNOSIS — Z9049 Acquired absence of other specified parts of digestive tract: Secondary | ICD-10-CM | POA: Insufficient documentation

## 2014-04-27 HISTORY — DX: Other seasonal allergic rhinitis: J30.2

## 2014-04-27 HISTORY — DX: Unspecified osteoarthritis, unspecified site: M19.90

## 2014-04-27 HISTORY — PX: CARPOMETACARPEL SUSPENSION PLASTY: SHX5005

## 2014-04-27 HISTORY — DX: Gastro-esophageal reflux disease without esophagitis: K21.9

## 2014-04-27 LAB — POCT HEMOGLOBIN-HEMACUE: Hemoglobin: 15.1 g/dL — ABNORMAL HIGH (ref 12.0–15.0)

## 2014-04-27 SURGERY — CARPOMETACARPEL (CMC) SUSPENSION PLASTY
Anesthesia: General | Site: Thumb | Laterality: Right

## 2014-04-27 MED ORDER — LIDOCAINE HCL (CARDIAC) 20 MG/ML IV SOLN
INTRAVENOUS | Status: DC | PRN
  Administered 2014-04-27: 80 mg via INTRAVENOUS

## 2014-04-27 MED ORDER — FENTANYL CITRATE 0.05 MG/ML IJ SOLN
INTRAMUSCULAR | Status: AC
  Filled 2014-04-27: qty 2

## 2014-04-27 MED ORDER — ONDANSETRON HCL 4 MG/2ML IJ SOLN
INTRAMUSCULAR | Status: DC | PRN
  Administered 2014-04-27: 4 mg via INTRAVENOUS

## 2014-04-27 MED ORDER — FENTANYL CITRATE 0.05 MG/ML IJ SOLN
INTRAMUSCULAR | Status: DC | PRN
  Administered 2014-04-27: 50 ug via INTRAVENOUS

## 2014-04-27 MED ORDER — OXYCODONE HCL 5 MG PO TABS: 5.0000 mg | ORAL_TABLET | Freq: Once | ORAL | Status: DC | PRN

## 2014-04-27 MED ORDER — MIDAZOLAM HCL 2 MG/2ML IJ SOLN
1.0000 mg | INTRAMUSCULAR | Status: DC | PRN
  Administered 2014-04-27: 2 mg via INTRAVENOUS

## 2014-04-27 MED ORDER — LACTATED RINGERS IV SOLN
INTRAVENOUS | Status: DC
  Administered 2014-04-27 (×2): via INTRAVENOUS

## 2014-04-27 MED ORDER — CHLORHEXIDINE GLUCONATE 4 % EX LIQD: 60.0000 mL | Freq: Once | CUTANEOUS | Status: DC

## 2014-04-27 MED ORDER — DEXAMETHASONE SODIUM PHOSPHATE 10 MG/ML IJ SOLN
INTRAMUSCULAR | Status: DC | PRN
  Administered 2014-04-27: 10 mg via INTRAVENOUS

## 2014-04-27 MED ORDER — MIDAZOLAM HCL 2 MG/2ML IJ SOLN
INTRAMUSCULAR | Status: AC
  Filled 2014-04-27: qty 2

## 2014-04-27 MED ORDER — BUPIVACAINE-EPINEPHRINE (PF) 0.5% -1:200000 IJ SOLN
INTRAMUSCULAR | Status: DC | PRN
  Administered 2014-04-27: 25 mL via PERINEURAL

## 2014-04-27 MED ORDER — ONDANSETRON HCL 4 MG/2ML IJ SOLN: 4.0000 mg | Freq: Once | INTRAMUSCULAR | Status: DC | PRN

## 2014-04-27 MED ORDER — PROPOFOL 10 MG/ML IV BOLUS
INTRAVENOUS | Status: DC | PRN
  Administered 2014-04-27: 200 mg via INTRAVENOUS

## 2014-04-27 MED ORDER — OXYCODONE-ACETAMINOPHEN 5-325 MG PO TABS: ORAL_TABLET | ORAL | Status: DC

## 2014-04-27 MED ORDER — CEFAZOLIN SODIUM-DEXTROSE 2-3 GM-% IV SOLR
2.0000 g | INTRAVENOUS | Status: AC
  Administered 2014-04-27: 2 g via INTRAVENOUS

## 2014-04-27 MED ORDER — FENTANYL CITRATE 0.05 MG/ML IJ SOLN
50.0000 ug | INTRAMUSCULAR | Status: DC | PRN
  Administered 2014-04-27: 100 ug via INTRAVENOUS

## 2014-04-27 MED ORDER — OXYCODONE HCL 5 MG/5ML PO SOLN: 5.0000 mg | Freq: Once | ORAL | Status: DC | PRN

## 2014-04-27 MED ORDER — HYDROMORPHONE HCL 1 MG/ML IJ SOLN: 0.2500 mg | INTRAMUSCULAR | Status: DC | PRN

## 2014-04-27 MED ORDER — CEFAZOLIN SODIUM-DEXTROSE 2-3 GM-% IV SOLR
INTRAVENOUS | Status: AC
  Filled 2014-04-27: qty 50

## 2014-04-27 SURGICAL SUPPLY — 75 items
BANDAGE ELASTIC 3 VELCRO ST LF (GAUZE/BANDAGES/DRESSINGS) ×2 IMPLANT
BLADE MINI RND TIP GREEN BEAV (BLADE) ×2 IMPLANT
BLADE SURG 15 STRL LF DISP TIS (BLADE) ×2 IMPLANT
BLADE SURG 15 STRL SS (BLADE) ×6
BNDG CMPR 9X4 STRL LF SNTH (GAUZE/BANDAGES/DRESSINGS) ×1
BNDG CMPR MD 5X2 ELC HKLP STRL (GAUZE/BANDAGES/DRESSINGS)
BNDG ELASTIC 2 VLCR STRL LF (GAUZE/BANDAGES/DRESSINGS) IMPLANT
BNDG ESMARK 4X9 LF (GAUZE/BANDAGES/DRESSINGS) ×3 IMPLANT
BNDG GAUZE ELAST 4 BULKY (GAUZE/BANDAGES/DRESSINGS) ×3 IMPLANT
CHLORAPREP W/TINT 26ML (MISCELLANEOUS) ×3 IMPLANT
CORDS BIPOLAR (ELECTRODE) ×3 IMPLANT
COVER BACK TABLE 60X90IN (DRAPES) ×3 IMPLANT
COVER MAYO STAND STRL (DRAPES) ×3 IMPLANT
CUFF TOURNIQUET SINGLE 18IN (TOURNIQUET CUFF) ×3 IMPLANT
DECANTER SPIKE VIAL GLASS SM (MISCELLANEOUS) IMPLANT
DRAPE EXTREMITY T 121X128X90 (DRAPE) ×3 IMPLANT
DRAPE OEC MINIVIEW 54X84 (DRAPES) ×2 IMPLANT
DRAPE SURG 17X23 STRL (DRAPES) ×3 IMPLANT
DRSG PAD ABDOMINAL 8X10 ST (GAUZE/BANDAGES/DRESSINGS) IMPLANT
GAUZE SPONGE 4X4 12PLY STRL (GAUZE/BANDAGES/DRESSINGS) ×3 IMPLANT
GAUZE XEROFORM 1X8 LF (GAUZE/BANDAGES/DRESSINGS) ×3 IMPLANT
GLOVE BIO SURGEON STRL SZ7.5 (GLOVE) ×3 IMPLANT
GLOVE BIOGEL PI IND STRL 7.0 (GLOVE) IMPLANT
GLOVE BIOGEL PI IND STRL 8 (GLOVE) ×1 IMPLANT
GLOVE BIOGEL PI IND STRL 8.5 (GLOVE) IMPLANT
GLOVE BIOGEL PI INDICATOR 7.0 (GLOVE) ×2
GLOVE BIOGEL PI INDICATOR 8 (GLOVE) ×2
GLOVE BIOGEL PI INDICATOR 8.5 (GLOVE) ×2
GLOVE ECLIPSE 6.5 STRL STRAW (GLOVE) ×2 IMPLANT
GLOVE EXAM NITRILE LRG STRL (GLOVE) ×2 IMPLANT
GLOVE SURG ORTHO 8.0 STRL STRW (GLOVE) ×2 IMPLANT
GOWN STRL REUS W/ TWL LRG LVL3 (GOWN DISPOSABLE) ×1 IMPLANT
GOWN STRL REUS W/TWL LRG LVL3 (GOWN DISPOSABLE) ×3
GOWN STRL REUS W/TWL XL LVL3 (GOWN DISPOSABLE) ×5 IMPLANT
K-WIRE .035X4 (WIRE) IMPLANT
NDL HYPO 25X1 1.5 SAFETY (NEEDLE) IMPLANT
NDL KEITH (NEEDLE) IMPLANT
NDL SAFETY ECLIPSE 18X1.5 (NEEDLE) IMPLANT
NEEDLE HYPO 18GX1.5 SHARP (NEEDLE)
NEEDLE HYPO 22GX1.5 SAFETY (NEEDLE) IMPLANT
NEEDLE HYPO 25X1 1.5 SAFETY (NEEDLE) IMPLANT
NEEDLE KEITH (NEEDLE) IMPLANT
NS IRRIG 1000ML POUR BTL (IV SOLUTION) ×3 IMPLANT
PACK BASIN DAY SURGERY FS (CUSTOM PROCEDURE TRAY) ×3 IMPLANT
PAD CAST 3X4 CTTN HI CHSV (CAST SUPPLIES) ×1 IMPLANT
PAD CAST 4YDX4 CTTN HI CHSV (CAST SUPPLIES) IMPLANT
PADDING CAST ABS 4INX4YD NS (CAST SUPPLIES) ×2
PADDING CAST ABS COTTON 4X4 ST (CAST SUPPLIES) ×1 IMPLANT
PADDING CAST COTTON 3X4 STRL (CAST SUPPLIES) ×3
PADDING CAST COTTON 4X4 STRL (CAST SUPPLIES)
PASSER SUT SWANSON 36MM LOOP (INSTRUMENTS) IMPLANT
SLEEVE SCD COMPRESS KNEE MED (MISCELLANEOUS) ×2 IMPLANT
SLING ARM FOAM STRAP LRG (SOFTGOODS) ×2 IMPLANT
SPLINT FAST PLASTER 5X30 (CAST SUPPLIES)
SPLINT PLASTER CAST FAST 5X30 (CAST SUPPLIES) IMPLANT
SPLINT PLASTER CAST XFAST 3X15 (CAST SUPPLIES) IMPLANT
SPLINT PLASTER CAST XFAST 4X15 (CAST SUPPLIES) IMPLANT
SPLINT PLASTER XTRA FAST SET 4 (CAST SUPPLIES)
SPLINT PLASTER XTRA FASTSET 3X (CAST SUPPLIES) ×40
STOCKINETTE 4X48 STRL (DRAPES) ×3 IMPLANT
SUT ETHIBOND 3-0 V-5 (SUTURE) ×4 IMPLANT
SUT ETHILON 3 0 PS 1 (SUTURE) IMPLANT
SUT ETHILON 4 0 PS 2 18 (SUTURE) ×2 IMPLANT
SUT FIBERWIRE 2-0 18 17.9 3/8 (SUTURE)
SUT MERSILENE 2.0 SH NDLE (SUTURE) IMPLANT
SUT MERSILENE 4 0 P 3 (SUTURE) IMPLANT
SUT SILK 4 0 PS 2 (SUTURE) IMPLANT
SUT STEEL 4 0 (SUTURE) ×2 IMPLANT
SUT VIC AB 0 SH 27 (SUTURE) IMPLANT
SUT VICRYL 4-0 PS2 18IN ABS (SUTURE) ×2 IMPLANT
SUTURE FIBERWR 2-0 18 17.9 3/8 (SUTURE) IMPLANT
SYR BULB 3OZ (MISCELLANEOUS) ×3 IMPLANT
SYR CONTROL 10ML LL (SYRINGE) IMPLANT
TOWEL OR 17X24 6PK STRL BLUE (TOWEL DISPOSABLE) ×6 IMPLANT
UNDERPAD 30X30 INCONTINENT (UNDERPADS AND DIAPERS) ×3 IMPLANT

## 2014-04-27 NOTE — Anesthesia Preprocedure Evaluation (Signed)
Anesthesia Evaluation  Patient identified by MRN, date of birth, ID band Patient awake    Reviewed: Allergy & Precautions, NPO status , Patient's Chart, lab work & pertinent test results  Airway Mallampati: I  TM Distance: >3 FB Neck ROM: Full    Dental  (+) Teeth Intact, Dental Advisory Given   Pulmonary Current Smoker,  breath sounds clear to auscultation        Cardiovascular hypertension, Pt. on medications Rhythm:Regular Rate:Normal     Neuro/Psych    GI/Hepatic GERD-  Medicated and Controlled,  Endo/Other    Renal/GU      Musculoskeletal   Abdominal   Peds  Hematology   Anesthesia Other Findings   Reproductive/Obstetrics                             Anesthesia Physical Anesthesia Plan  ASA: II  Anesthesia Plan: General   Post-op Pain Management:    Induction: Intravenous  Airway Management Planned: LMA  Additional Equipment:   Intra-op Plan:   Post-operative Plan: Extubation in OR  Informed Consent: I have reviewed the patients History and Physical, chart, labs and discussed the procedure including the risks, benefits and alternatives for the proposed anesthesia with the patient or authorized representative who has indicated his/her understanding and acceptance.   Dental advisory given  Plan Discussed with: CRNA, Anesthesiologist and Surgeon  Anesthesia Plan Comments:         Anesthesia Quick Evaluation  

## 2014-04-27 NOTE — Anesthesia Postprocedure Evaluation (Signed)
  Anesthesia Post-op Note  Patient: Jamie Clay  Procedure(s) Performed: Procedure(s): RIGHT THUMB CARPOMETACARPAL ARTHROPLASTY  [name]  (Right)  Patient Location: PACU  Anesthesia Type: General with regional   Level of Consciousness: awake, alert  and oriented  Airway and Oxygen Therapy: Patient Spontanous Breathing  Post-op Pain:  None  Post-op Assessment: Post-op Vital signs reviewed  Post-op Vital Signs: Reviewed  Last Vitals:  Filed Vitals:   04/27/14 1445  BP: 115/69  Pulse: 73  Temp:   Resp: 13    Complications: No apparent anesthesia complications

## 2014-04-27 NOTE — Transfer of Care (Signed)
Immediate Anesthesia Transfer of Care Note  Patient: Jamie Clay  Procedure(s) Performed: Procedure(s): RIGHT THUMB CARPOMETACARPAL ARTHROPLASTY  [name]  (Right)  Patient Location: PACU  Anesthesia Type:GA combined with regional for post-op pain  Level of Consciousness: sedated  Airway & Oxygen Therapy: Patient Spontanous Breathing and Patient connected to face mask oxygen  Post-op Assessment: Report given to RN and Post -op Vital signs reviewed and stable  Post vital signs: Reviewed and stable  Last Vitals:  Filed Vitals:   04/27/14 1405  BP:   Pulse: 96  Temp:   Resp:     Complications: No apparent anesthesia complications

## 2014-04-27 NOTE — Op Note (Signed)
[id number] 

## 2014-04-27 NOTE — Op Note (Signed)
Intra-operative fluoroscopic images in the AP, lateral, and oblique views were taken and evaluated by myself.  Reduction and hardware placement were confirmed.  There was no intraarticular penetration of permanent hardware.  

## 2014-04-27 NOTE — H&P (Signed)
  Jamie Clay is an 44 y.o. female.   Chief Complaint: right thumb cmc osteoarthritis HPI: 44 yo rhd female with right thumb cmc pain x 2 years.  Has tried splinting and injections without relief.  She wishes to have a cmc arthroplasty for management of symptoms.  Past Medical History  Diagnosis Date  . Hypertension   . Arthritis   . Seasonal allergies   . GERD (gastroesophageal reflux disease)     Past Surgical History  Procedure Laterality Date  . Cesarean section  02,95  . Tonsillectomy    . Cholecystectomy  1997  . Tubal ligation    . Abdominal hysterectomy  2003  . Lumbar disc surgery  2009  . Lumbar fusion  2011    Family History  Problem Relation Age of Onset  . Heart attack Father 63    X2  . Heart disease Sister    Social History:  reports that she has been smoking.  She does not have any smokeless tobacco history on file. She reports that she drinks alcohol. She reports that she does not use illicit drugs.  Allergies: No Known Allergies  No prescriptions prior to admission    Results for orders placed or performed during the hospital encounter of 04/27/14 (from the past 48 hour(s))  Basic metabolic panel     Status: Abnormal   Collection Time: 04/26/14  8:25 AM  Result Value Ref Range   Sodium 137 135 - 145 mmol/L   Potassium 3.8 3.5 - 5.1 mmol/L   Chloride 106 96 - 112 mmol/L   CO2 25 19 - 32 mmol/L   Glucose, Bld 102 (H) 70 - 99 mg/dL   BUN 9 6 - 23 mg/dL   Creatinine, Ser 0.96 0.50 - 1.10 mg/dL   Calcium 8.8 8.4 - 10.5 mg/dL   GFR calc non Af Amer 71 (L) >90 mL/min   GFR calc Af Amer 82 (L) >90 mL/min    Comment: (NOTE) The eGFR has been calculated using the CKD EPI equation. This calculation has not been validated in all clinical situations. eGFR's persistently <90 mL/min signify possible Chronic Kidney Disease.    Anion gap 6 5 - 15    No results found.   A comprehensive review of systems was negative except for: Eyes: positive for  contacts/glasses  Height $Remov'5\' 8"'eBxTpJ$  (1.727 m), weight 93.441 kg (206 lb).  General appearance: alert, cooperative and appears stated age Head: Normocephalic, without obvious abnormality, atraumatic Neck: supple, symmetrical, trachea midline Resp: clear to auscultation bilaterally Cardio: regular rate and rhythm GI: non tender Extremities: intact sensation and capillary refill all digits.  +epl/fpl/io.  no wounds. Pulses: 2+ and symmetric Skin: Skin color, texture, turgor normal. No rashes or lesions Neurologic: Grossly normal Incision/Wound: none  Assessment/Plan Right thumb cmc arthritis with continued pain with non operative treatment.  Non operative and operative treatment options were discussed with the patient and patient wishes to proceed with operative treatment. Risks, benefits, and alternatives of surgery were discussed and the patient agrees with the plan of care.   Choya Tornow R 04/27/2014, 8:56 AM

## 2014-04-27 NOTE — Anesthesia Procedure Notes (Addendum)
Anesthesia Regional Block:  Supraclavicular block  Pre-Anesthetic Checklist: ,, timeout performed, Correct Patient, Correct Site, Correct Laterality, Correct Procedure, Correct Position, site marked, Risks and benefits discussed,  Surgical consent,  Pre-op evaluation,  At surgeon's request and post-op pain management  Laterality: Right and Upper  Prep: chloraprep       Needles:  Injection technique: Single-shot  Needle Type: Echogenic Stimulator Needle     Needle Length: 5cm 5 cm Needle Gauge: 21 and 21 G    Additional Needles:  Procedures: ultrasound guided (picture in chart) Supraclavicular block Narrative:  Start time: 04/27/2014 12:13 PM End time: 04/27/2014 12:17 PM Injection made incrementally with aspirations every 5 mL.  Performed by: Personally  Anesthesiologist: CREWS, DAVID A   Procedure Name: LMA Insertion Date/Time: 04/27/2014 12:42 PM Performed by: Burna CashONRAD, Brittyn Salaz C Pre-anesthesia Checklist: Patient identified, Emergency Drugs available, Suction available and Patient being monitored Patient Re-evaluated:Patient Re-evaluated prior to inductionOxygen Delivery Method: Circle System Utilized Preoxygenation: Pre-oxygenation with 100% oxygen Intubation Type: IV induction Ventilation: Mask ventilation without difficulty LMA: LMA inserted LMA Size: 4.0 Number of attempts: 1 Airway Equipment and Method: Bite block Placement Confirmation: positive ETCO2 Tube secured with: Tape Dental Injury: Teeth and Oropharynx as per pre-operative assessment     Procedure Name: LMA Insertion Date/Time: 04/27/2014 12:42 PM Performed by: Burna CashONRAD, Leoda Smithhart C Pre-anesthesia Checklist: Patient identified, Emergency Drugs available, Suction available and Patient being monitored Patient Re-evaluated:Patient Re-evaluated prior to inductionOxygen Delivery Method: Circle System Utilized Preoxygenation: Pre-oxygenation with 100% oxygen Intubation Type: IV induction Ventilation: Mask ventilation  without difficulty LMA: LMA inserted LMA Size: 4.0 Number of attempts: 1 Airway Equipment and Method: Bite block Placement Confirmation: positive ETCO2 Tube secured with: Tape Dental Injury: Teeth and Oropharynx as per pre-operative assessment

## 2014-04-27 NOTE — Progress Notes (Signed)
Assisted Dr. Crews with right, ultrasound guided, supraclavicular block. Side rails up, monitors on throughout procedure. See vital signs in flow sheet. Tolerated Procedure well. 

## 2014-04-27 NOTE — Discharge Instructions (Addendum)
[facility] Instructions °Hand Surgery ° °Wound Care: °Keep your hand elevated above the level of your heart.  Do not allow it to dangle by your side.  Keep the dressing dry and do not remove it unless your doctor advises you to do so.  He will usually change it at the time of your post-op visit.  Moving your fingers is advised to stimulate circulation but will depend on the site of your surgery.  If you have a splint applied, your doctor will advise you regarding movement. ° °Activity: °Do not drive or operate machinery today.  Rest today and then you may return to your normal activity and work as indicated by your physician. ° °Diet:  °Drink liquids today or eat a light diet.  You may resume a regular diet tomorrow.   ° °General expectations: °Pain for two to three days. °Fingers may become slightly swollen. ° °Call your doctor if any of the following occur: °Severe pain not relieved by pain medication. °Elevated temperature. °Dressing soaked with blood. °Inability to move fingers. °White or bluish color to fingers. ° ° °Regional Anesthesia Blocks ° °1. Numbness or the inability to move the "blocked" extremity may last from 3-48 hours after placement. The length of time depends on the medication injected and your individual response to the medication. If the numbness is not going away after 48 hours, call your surgeon. ° °2. The extremity that is blocked will need to be protected until the numbness is gone and the  Strength has returned. Because you cannot feel it, you will need to take extra care to avoid injury. Because it may be weak, you may have difficulty moving it or using it. You may not know what position it is in without looking at it while the block is in effect. ° °3. For blocks in the legs and feet, returning to weight bearing and walking needs to be done carefully. You will need to wait until the numbness is entirely gone and the strength has returned. You should be able to move your leg and foot  normally before you try and bear weight or walk. You will need someone to be with you when you first try to ensure you do not fall and possibly risk injury. ° °4. Bruising and tenderness at the needle site are common side effects and will resolve in a few days. ° °5. Persistent numbness or new problems with movement should be communicated to the surgeon or the  Surgery Center ([phone])/ [facility] ([phone]). ° ° °Post Anesthesia Home Care Instructions ° °Activity: °Get plenty of rest for the remainder of the day. A responsible adult should stay with you for 24 hours following the procedure.  °For the next 24 hours, DO NOT: °-Drive a car °-Operate machinery °-Drink alcoholic beverages °-Take any medication unless instructed by your physician °-Make any legal decisions or sign important papers. ° °Meals: °Start with liquid foods such as gelatin or soup. Progress to regular foods as tolerated. Avoid greasy, spicy, heavy foods. If nausea and/or vomiting occur, drink only clear liquids until the nausea and/or vomiting subsides. Call your physician if vomiting continues. ° °Special Instructions/Symptoms: °Your throat may feel dry or sore from the anesthesia or the breathing tube placed in your throat during surgery. If this causes discomfort, gargle with warm salt water. The discomfort should disappear within 24 hours. ° °

## 2014-04-27 NOTE — Brief Op Note (Signed)
04/27/2014  2:00 PM  PATIENT:  Wynelle Fannyammy L Kable  44 y.o. female  PRE-OPERATIVE DIAGNOSIS:  RIGHT THUMB END STAGE CARPOMETACARPAL OSTEOARTHRITIS   POST-OPERATIVE DIAGNOSIS:  RIGHT THUMB END STAGE CARPOMETACARPAL OSTEOARTHRITIS   PROCEDURE:  Procedure(s): RIGHT THUMB CARPOMETACARPAL ARTHROPLASTY  [name]  (Right)  SURGEON:  Surgeon(s) and Role:    * Betha LoaKevin Ulisses Vondrak, MD - Primary    * Cindee SaltGary Roshard Rezabek, MD - Assisting  PHYSICIAN ASSISTANT:   ASSISTANTS: Cindee SaltGary Nicol Herbig, MD   ANESTHESIA:   regional and general  EBL:  Total I/O In: 1300 [I.V.:1300] Out: -   BLOOD ADMINISTERED:none  DRAINS: none   LOCAL MEDICATIONS USED:  NONE  SPECIMEN:  No Specimen  DISPOSITION OF SPECIMEN:  N/A  COUNTS:  YES  TOURNIQUET:   Total Tourniquet Time Documented: Upper Arm (Right) - 63 minutes Total: Upper Arm (Right) - 63 minutes   DICTATION: .Other Dictation: Dictation Number [id number]  PLAN OF CARE: Discharge to home after PACU  PATIENT DISPOSITION:  PACU - hemodynamically stable.

## 2014-04-28 ENCOUNTER — Encounter (HOSPITAL_BASED_OUTPATIENT_CLINIC_OR_DEPARTMENT_OTHER): Payer: Self-pay | Admitting: Orthopedic Surgery

## 2014-04-28 NOTE — Op Note (Signed)
NAMMaudry Clay:  Fuente,                      ACCOUNT NO.:  [id number]  MEDICAL RECORD NO.:  [id number]  LOCATION:                                 FACILITY:  PHYSICIAN:  Betha LoaKevin Ike Maragh, MD             DATE OF BIRTH:  DATE OF PROCEDURE:  04/27/2014 DATE OF DISCHARGE:                              OPERATIVE REPORT   PREOPERATIVE DIAGNOSIS:  Right thumb carpometacarpal osteoarthritis.  POSTOPERATIVE DIAGNOSIS:  Right thumb carpometacarpal osteoarthritis.  PROCEDURE:  Right thumb CMC arthroplasty [name] techniques.  SURGEON:  Betha LoaKevin Krew Hortman, MD  ASSISTANT:  Cindee SaltGary Shirley Decamp, MD.  ANESTHESIA:  General with regional.  IV FLUIDS:  Per anesthesia flow sheet.  ESTIMATED BLOOD LOSS:  Minimal.  COMPLICATIONS:  None.  SPECIMENS:  None.  TOURNIQUET TIME:  63 minutes.  DISPOSITION:  Stable to PACU.  INDICATIONS:  [name] is a 44 year old female who has had 2 years of pain at the base of the right thumb.  She has tried splinting and injections without lasting relief.  She wished to have a CMC arthroplasty.  Risks, benefits, and alternatives of the surgery were discussed including risk of blood loss, infection; damage to nerves, vessels, tendons, ligaments, bone; failure of surgery; need for additional surgery, complications with wound healing, continued pain, and instability.  She voiced understanding of these risks and elected to proceed.  OPERATIVE COURSE:  After being identified preoperatively by myself, the patient and I agreed upon procedure and site of procedure.  Surgical site was marked.  Risks, benefits, and alternatives of surgery were reviewed and she wished to proceed.  Surgical consent had been signed. She was given IV Ancef as preoperative antibiotic prophylaxis.  She was transferred to the operating room and placed on the operating room table in supine position with the right upper extremity on arm board. Regional block was performed by Anesthesia in preoperative  holding. General anesthesia was induced by Anesthesia in the operating room. Right upper extremity was prepped and draped in normal sterile orthopedic fashion.  Surgical pause was performed between surgeons, anesthesia, operating staff, and all were in agreement as to the patient, procedure, and site of procedure.  Tourniquet at the proximal aspect of the extremity was inflated to 250 mmHg after exsanguination with an Esmarch bandage.  An incision was made at the glaborous and hairy border of the thumb and volarly at the wrist crease.  This was carried into subcutaneous tissues by spreading technique.  Bipolar electrocautery was used to obtain hemostasis.  Care was taken to ensure all neurovascular structures.  Superficial branch of the radial nerve was identified and preserved.  The radial artery was identified and protected throughout the case.  The C-arm was used to aid in localization of the trapezium. Once the trapezium was located, the capsule was incised.  The trapezium was removed in its entirety.  The FCR tendon was identified.  An incision was made in the forearm to aid in harvesting.  Half of the tendon was harvested.  A 32-gauge wire was used to perform the harvest. The tendon was left attached distally.  It was then brought into  the arthroplasty cavity.  It was weaved back and forth in a [name] technique between the APL and FCR tendon that remained.  A 3-0 Ethibond suture was used to secure the weaving.  C-arm was used in the AP plane to ensure appropriate suspension of the thumb metacarpal which was the case.  With axial pressure placed on the thumb, it did not collapse. The wound was copiously irrigated with sterile saline.  The capsule was repaired with 4-0 Vicryl suture.  Two inverted interrupted Vicryl sutures were placed in subcutaneous tissues.  The skin was closed with 4- 0 nylon in a horizontal mattress fashion.  The remaining portion of the APL had been  imbricated to take out the slack in it.  This was done with the Ethibond suture.  The incision of the forearm was closed with 4-0 nylon in a horizontal mattress fashion.  The wounds were dressed with sterile Xeroform, 4x4s, and wrapped with Kerlix bandage.  Thumb spica splint was placed and wrapped with Kerlix and Ace bandage.  Tourniquet was deflated at 63 minutes.  Fingertips were pink with brisk capillary refill after deflation of tourniquet.  Operative drapes were broken down.  The patient was awoken from anesthesia safely.  She was transferred back to stretcher and taken to PACU in stable condition.  I will see her back in the office in 1 week for postoperative followup.  I will give her Percocet 5/325, 1-2 p.o. q.6 hours p.r.n. pain, dispensed #40.     Betha Loa, MD     KK/MEDQ  D:  04/27/2014  T:  04/28/2014  Job:  [id number]

## 2014-12-23 ENCOUNTER — Encounter: Payer: Self-pay | Admitting: Advanced Practice Midwife

## 2014-12-23 ENCOUNTER — Ambulatory Visit (INDEPENDENT_AMBULATORY_CARE_PROVIDER_SITE_OTHER): Payer: 59 | Admitting: Advanced Practice Midwife

## 2014-12-23 VITALS — BP 124/82 | Ht 68.0 in | Wt 222.0 lb

## 2014-12-23 DIAGNOSIS — Z01419 Encounter for gynecological examination (general) (routine) without abnormal findings: Secondary | ICD-10-CM

## 2014-12-23 NOTE — Patient Instructions (Signed)
Pycnogenol  /day for a week then /day for hot flashes

## 2014-12-23 NOTE — Progress Notes (Signed)
Jamie Clay 44 y.o.  Filed Vitals:   12/23/14 1507  BP: 124/82     Past Medical History: Past Medical History  Diagnosis Date  . Hypertension   . Arthritis   . Seasonal allergies   . GERD (gastroesophageal reflux disease)     Past Surgical History: Past Surgical History  Procedure Laterality Date  . Cesarean section  02,95  . Tonsillectomy    . Cholecystectomy  1997  . Tubal ligation    . Abdominal hysterectomy  2003  . Lumbar disc surgery  2009  . Lumbar fusion  2011  . Carpometacarpel suspension plasty Right 04/27/2014    Procedure: RIGHT THUMB CARPOMETACARPAL ARTHROPLASTY  [name] ;  Surgeon: Betha Loa, MD;  Location: [facility];  Service: Orthopedics;  Laterality: Right;    Family History: Family History  Problem Relation Age of Onset  . Heart attack Father 21    X2  . Diabetes Father   . Heart disease Father   . Cancer Mother 22    colon  . Asthma Sister   . Diabetes Sister   . Heart disease Sister     Social History: Social History  Substance Use Topics  . Smoking status: Current Every Day Smoker -- 0.25 packs/day for 25 years    Types: Cigarettes  . Smokeless tobacco: Never Used  . Alcohol Use: No     Comment: occ    Allergies: No Known Allergies    Current outpatient prescriptions:  .  amLODipine (NORVASC) 5 MG tablet, Take 10 mg by mouth daily. , Disp: , Rfl:  .  cetirizine (ZYRTEC) 10 MG tablet, Take 10 mg by mouth daily., Disp: , Rfl:  .  esomeprazole (NEXIUM) 40 MG capsule, Take 40 mg by mouth daily at 12 noon., Disp: , Rfl:  .  hydrochlorothiazide (MICROZIDE) 12.5 MG capsule, Take 12.5 mg by mouth daily., Disp: , Rfl:  .  montelukast (SINGULAIR) 10 MG tablet, Take 10 mg by mouth at bedtime., Disp: , Rfl:   History of Present Illness: Here for pap and physical. Had a hysterectomy 2003 for bleeding, so doesn't need pap. PCP manages HTN, labs.  Has hot flashes at night, not too troublesome. Gets yearly mammograms at  work. Had benign bx 3-4 years ago  Review of Systems   Patient denies any headaches, blurred vision, shortness of breath, chest pain, abdominal pain, problems with bowel movements, urination, or intercourse.   Physical Exam: General:  Well developed, well nourished, no acute distress Skin:  Warm and dry Neck:  Midline trachea, normal thyroid Lungs; Clear to auscultation bilaterally Breast:  No dominant palpable mass, retraction, or nipple discharge Cardiovascular: Regular rate and rhythm Abdomen:  Soft, non tender, no hepatosplenomegaly Pelvic:  External genitalia is normal in appearance.  The vagina is normal in appearance.  T No adnexal masses or tenderness noted. Exam limited by body habitus Extremities:  No swelling or varicosities noted Psych:  No mood changes.     Impression: Normal GYN exam Vasomotor sx, mild   Plan: May try pycnogenol for hot flashes

## 2015-08-01 HISTORY — PX: COLON SURGERY: SHX602

## 2015-09-15 ENCOUNTER — Encounter: Payer: Self-pay | Admitting: Physician Assistant

## 2015-09-15 ENCOUNTER — Ambulatory Visit (INDEPENDENT_AMBULATORY_CARE_PROVIDER_SITE_OTHER): Payer: 59 | Admitting: Physician Assistant

## 2015-09-15 VITALS — BP 118/80 | HR 104 | Ht 67.5 in | Wt 221.5 lb

## 2015-09-15 DIAGNOSIS — K631 Perforation of intestine (nontraumatic): Secondary | ICD-10-CM | POA: Diagnosis not present

## 2015-09-15 DIAGNOSIS — K3580 Unspecified acute appendicitis: Secondary | ICD-10-CM

## 2015-09-15 DIAGNOSIS — K5710 Diverticulosis of small intestine without perforation or abscess without bleeding: Secondary | ICD-10-CM

## 2015-09-15 MED ORDER — NA SULFATE-K SULFATE-MG SULF 17.5-3.13-1.6 GM/177ML PO SOLN: 1.0000 | Freq: Once | ORAL | Status: AC

## 2015-09-15 NOTE — Patient Instructions (Signed)
Start Benefiber daily in a large glass of water.  Push water intake.  You have been scheduled for a colonoscopy. Please follow written instructions given to you at your visit today.  Please pick up your prep supplies at the pharmacy within the next 1-3 days. If you use inhalers (even only as needed), please bring them with you on the day of your procedure. Your physician has requested that you go to www.startemmi.com and enter the access code given to you at your visit today. This web site gives a general overview about your procedure. However, you should still follow specific instructions given to you by our office regarding your preparation for the procedure.

## 2015-09-15 NOTE — Progress Notes (Signed)
Patient ID: Jamie Clay, female   DOB: 07-15-1970, 45 y.o.   MRN: [id number]   Subjective:    Patient ID: Hendricks Clay, female    DOB: 01-Sep-1970, 45 y.o.   MRN: [id number]  HPI Jamie Clay is a pleasant 45 year old white female, new to GI today referred by Dr. Dione Housekeeper for colonoscopy. Patient is generally in good health did undergo prior cholecystectomy, hysterectomy in 2012 and had a laminectomy in 2011. She had a recent admission to [facility] on 08/01/2015 for probable perforated appendicitis. She had developed right lower quadrant pain on the morning of 07/29/2015 which had persisted and progressed. She was evaluated at an outside hospital underwent CT scan that showed perforated appendicitis. I have reviewed her hospital records from that admission. At the time of exploratory lap she was converted to an open appendectomy. She was found to have purulent fluid in her pelvis and a focal area of inflamed sigmoid colon without obvious perforation remainder of the sigmoid colon appeared healthy. Appendectomy was performed. A JP was left in place and her wound was left open at her skin and fascia was sutured closed. She had an uneventful recovery completed 14 days of antibiotics. There was concern at the time of the hospitalization that she may actually have had a diverticular perforation. Path from her surgery shows a suppurative. Appendicitis with focal abscess formation, there was no bowel resected She was told by the surgeon, and by her PCP that she should have colonoscopy in follow-up. She is still packing her wound which she says has closed up significantly. Overall she feels well ,does still has some excess bloating etc. She has not had any problems with her bowels no diarrhea, no bleeding no fever sweats etc. her appetite is been good. She reports having 1 prior colonoscopy about 16 years ago, she thinks because of difficulty with hemorrhoids but does not remember where that  was done or who did the procedure.  Review of Systems Pertinent positive and negative review of systems were noted in the above HPI section.  All other review of systems was otherwise negative.  Outpatient Encounter Prescriptions as of 09/15/2015  Medication Sig  . amLODipine (NORVASC) 10 MG tablet Take 1 tablet by mouth daily.  . Choline Fenofibrate (FENOFIBRIC ACID) 135 MG CPDR Take 1 tablet by mouth daily.  Marland Kitchen esomeprazole (NEXIUM) 40 MG capsule Take 40 mg by mouth daily at 12 noon.  . fexofenadine (ALLEGRA) 30 MG tablet Take 1 tablet by mouth daily.  . hydrochlorothiazide (MICROZIDE) 12.5 MG capsule Take 12.5 mg by mouth daily.  Marland Kitchen HYDROcodone-acetaminophen (NORCO/VICODIN) 5-325 MG tablet Take 1 tablet by mouth at bedtime.  Marland Kitchen ibuprofen (ADVIL,MOTRIN) 200 MG tablet Take 600-800 mg by mouth every 4 (four) hours as needed.  . montelukast (SINGULAIR) 10 MG tablet Take 10 mg by mouth at bedtime.  . Na Sulfate-K Sulfate-Mg Sulf 17.5-3.13-1.6 GM/180ML SOLN Take 1 kit by mouth once.  . [DISCONTINUED] amLODipine (NORVASC) 5 MG tablet Take 10 mg by mouth daily.   . [DISCONTINUED] cetirizine (ZYRTEC) 10 MG tablet Take 10 mg by mouth daily.   No facility-administered encounter medications on file as of 09/15/2015.   No Known Allergies Patient Active Problem List   Diagnosis Date Noted  . DYSLIPIDEMIA 08/03/2009  . OBESITY, UNSPECIFIED 08/03/2009  . HYPERTENSION 08/03/2009  . PRECORDIAL PAIN 08/03/2009   Social History   Social History  . Marital Status: Married    Spouse Name: N/A  . Number  of Children: 2  . Years of Education: N/A   Occupational History  . cliet support rep    Social History Main Topics  . Smoking status: Current Every Day Smoker -- 0.25 packs/day for 25 years    Types: Cigarettes  . Smokeless tobacco: Never Used  . Alcohol Use: No  . Drug Use: No  . Sexual Activity: Yes    Birth Control/ Protection: Surgical   Other Topics Concern  . Not on file   Social  History Narrative    Jamie Clay's family history includes Asthma in her sister; Colon cancer (age of onset: 20) in her mother; Diabetes in her father and sister; Heart attack in her paternal grandfather; Heart attack (age of onset: 17) in her father; Heart disease in her father and sister; Lung cancer in her maternal grandfather.      Objective:    Filed Vitals:   09/15/15 0923  BP: 118/80  Pulse: 104    Physical Exam  well-developed white female in no acute distress, pleasant blood pressure 118/80 pulse 108 5 foot 7 weight 221 BMI 34.1. HEENT; nontraumatic normocephalic EOMI PERRLA sclera anicteric irregular regular rate and rhythm with S1-S2, Pulmonary; clear bilaterally, Abdomen; soft, bowel sounds are present she has a healing lower midline open incisional scar no palpable mass or hepatosplenomegaly, Rectal ;exam not done, Ext; no clubbing cyanosis or edema skin warm and dry, Neuropsych ;mood and affect appropriate     Assessment & Plan:   #60 45 year old female status post emergency exploratory lap 08/01/2015 done at [facility] with finding of peritonitis, probable perforated appendicitis and had focal inflamed segment of sigmoid colon suspicious for perforation but no obvious perforation found raising question of a diverticular perforation. Patient is recovering well, but is still dealing with an open skin wound which is healing. Rule out underlying IBD rule out diverticular disease versus simple perforated appendicitis   #2 status post cholecystectomy #3 status post hysterectomy  Plan; start Benefiber daily with liberal water intake daily Will schedule for colonoscopy with Dr. Henrene Pastor. Procedure discussed in detail with patient including risks and benefits and she is agreeable to proceed. We will intentionally schedule this for to 6 weeks out to allow continued healing of her abdominal incision in the interim   Alfredia Ferguson PA-C 09/15/2015   Cc: Dione Housekeeper, MD

## 2015-09-16 NOTE — Progress Notes (Signed)
Agree with plans for colonoscopy, but not until wound has essentially healed or healed. If not healed in 6 weeks as planned, she can contact the office and reschedule to a later date.

## 2015-10-04 ENCOUNTER — Encounter: Payer: Self-pay | Admitting: Internal Medicine

## 2015-10-14 ENCOUNTER — Telehealth: Payer: Self-pay | Admitting: Internal Medicine

## 2015-10-16 NOTE — Telephone Encounter (Signed)
No

## 2015-10-17 ENCOUNTER — Encounter: Payer: 59 | Admitting: Internal Medicine

## 2015-11-04 DIAGNOSIS — K219 Gastro-esophageal reflux disease without esophagitis: Secondary | ICD-10-CM | POA: Insufficient documentation

## 2015-11-10 DIAGNOSIS — J309 Allergic rhinitis, unspecified: Secondary | ICD-10-CM | POA: Insufficient documentation

## 2016-01-17 LAB — HM COLONOSCOPY

## 2018-08-14 LAB — HM MAMMOGRAPHY

## 2018-12-22 ENCOUNTER — Other Ambulatory Visit: Payer: Self-pay | Admitting: Family Medicine

## 2018-12-22 NOTE — Telephone Encounter (Signed)
Please advise if rx's can be refilled until appointment on 10/16 (new patient)? Send back to pools

## 2018-12-23 NOTE — Telephone Encounter (Signed)
I cannot refill medications for a patient that is not already established here. I recommend she go to urgent care if she will need refills before her appointment.

## 2018-12-23 NOTE — Telephone Encounter (Signed)
Patient aware.

## 2019-01-06 ENCOUNTER — Other Ambulatory Visit: Payer: Self-pay

## 2019-01-07 ENCOUNTER — Ambulatory Visit: Payer: 59 | Admitting: Family Medicine

## 2019-01-07 ENCOUNTER — Encounter: Payer: Self-pay | Admitting: Family Medicine

## 2019-01-07 VITALS — BP 114/80 | HR 83 | Temp 98.4°F | Ht 67.5 in | Wt 245.0 lb

## 2019-01-07 DIAGNOSIS — J309 Allergic rhinitis, unspecified: Secondary | ICD-10-CM

## 2019-01-07 DIAGNOSIS — Z23 Encounter for immunization: Secondary | ICD-10-CM

## 2019-01-07 DIAGNOSIS — E782 Mixed hyperlipidemia: Secondary | ICD-10-CM

## 2019-01-07 DIAGNOSIS — I10 Essential (primary) hypertension: Secondary | ICD-10-CM

## 2019-01-07 DIAGNOSIS — N1831 Chronic kidney disease, stage 3a: Secondary | ICD-10-CM

## 2019-01-07 DIAGNOSIS — M62830 Muscle spasm of back: Secondary | ICD-10-CM

## 2019-01-07 DIAGNOSIS — E669 Obesity, unspecified: Secondary | ICD-10-CM | POA: Insufficient documentation

## 2019-01-07 DIAGNOSIS — K219 Gastro-esophageal reflux disease without esophagitis: Secondary | ICD-10-CM

## 2019-01-07 LAB — CMP14+EGFR
ALT: 26 IU/L (ref 0–32)
AST: 16 IU/L (ref 0–40)
Albumin/Globulin Ratio: 2 (ref 1.2–2.2)
Albumin: 4.1 g/dL (ref 3.8–4.8)
Alkaline Phosphatase: 43 IU/L (ref 39–117)
BUN/Creatinine Ratio: 16 (ref 9–23)
BUN: 19 mg/dL (ref 6–24)
Bilirubin Total: 0.4 mg/dL (ref 0.0–1.2)
CO2: 21 mmol/L (ref 20–29)
Calcium: 9.4 mg/dL (ref 8.7–10.2)
Chloride: 106 mmol/L (ref 96–106)
Creatinine, Ser: 1.2 mg/dL — ABNORMAL HIGH (ref 0.57–1.00)
GFR calc Af Amer: 61 mL/min/{1.73_m2} (ref >59)
GFR calc non Af Amer: 53 mL/min/{1.73_m2} — ABNORMAL LOW (ref >59)
Globulin, Total: 2.1 g/dL (ref 1.5–4.5)
Glucose: 104 mg/dL — ABNORMAL HIGH (ref 65–99)
Potassium: 4.3 mmol/L (ref 3.5–5.2)
Sodium: 141 mmol/L (ref 134–144)
Total Protein: 6.2 g/dL (ref 6.0–8.5)

## 2019-01-07 LAB — LIPID PANEL
Chol/HDL Ratio: 7.3 ratio — ABNORMAL HIGH (ref 0.0–4.4)
Cholesterol, Total: 211 mg/dL — ABNORMAL HIGH (ref 100–199)
HDL: 29 mg/dL — ABNORMAL LOW (ref >39)
LDL Chol Calc (NIH): 144 mg/dL — ABNORMAL HIGH (ref 0–99)
Triglycerides: 208 mg/dL — ABNORMAL HIGH (ref 0–149)
VLDL Cholesterol Cal: 38 mg/dL (ref 5–40)

## 2019-01-07 MED ORDER — FENOFIBRATE 160 MG PO TABS: 160.0000 mg | ORAL_TABLET | Freq: Every day | ORAL | 3 refills | Status: DC

## 2019-01-07 MED ORDER — AMLODIPINE BESYLATE 10 MG PO TABS: 10.0000 mg | ORAL_TABLET | Freq: Every day | ORAL | 3 refills | Status: DC

## 2019-01-07 MED ORDER — MONTELUKAST SODIUM 10 MG PO TABS: 10.0000 mg | ORAL_TABLET | Freq: Every day | ORAL | 3 refills | Status: DC

## 2019-01-07 MED ORDER — ESOMEPRAZOLE MAGNESIUM 40 MG PO CPDR: 40.0000 mg | DELAYED_RELEASE_CAPSULE | Freq: Every day | ORAL | 3 refills | Status: DC

## 2019-01-07 MED ORDER — METHOCARBAMOL 500 MG PO TABS: 500.0000 mg | ORAL_TABLET | Freq: Three times a day (TID) | ORAL | 1 refills | Status: DC | PRN

## 2019-01-07 MED ORDER — HYDROCHLOROTHIAZIDE 12.5 MG PO CAPS: 12.5000 mg | ORAL_CAPSULE | Freq: Every day | ORAL | 3 refills | Status: DC

## 2019-01-07 NOTE — Patient Instructions (Signed)

## 2019-01-07 NOTE — Progress Notes (Signed)
New Patient Office Visit  Assessment & Plan:  1. Essential hypertension - Well controlled on current regimen.  - amLODipine (NORVASC) 10 MG tablet; Take 1 tablet (10 mg total) by mouth daily.  Dispense: 90 tablet; Refill: 3 - hydrochlorothiazide (MICROZIDE) 12.5 MG capsule; Take 1 capsule (12.5 mg total) by mouth daily.  Dispense: 90 capsule; Refill: 3 - CMP14+EGFR - Lipid panel  2. Chronic allergic rhinitis - Well controlled on current regimen.  - montelukast (SINGULAIR) 10 MG tablet; Take 1 tablet (10 mg total) by mouth at bedtime.  Dispense: 90 tablet; Refill: 3  3. Gastroesophageal reflux disease without esophagitis - Well controlled on current regimen.  - esomeprazole (NEXIUM) 40 MG capsule; Take 1 capsule (40 mg total) by mouth daily at 12 noon.  Dispense: 90 capsule; Refill: 3 - CMP14+EGFR  4. Mixed hyperlipidemia - Well controlled on current regimen.  - fenofibrate 160 MG tablet; Take 1 tablet (160 mg total) by mouth daily.  Dispense: 90 tablet; Refill: 3 - CMP14+EGFR - Lipid panel  5. Muscle spasm of back - methocarbamol (ROBAXIN) 500 MG tablet; Take 1-2 tablets (500-1,000 mg total) by mouth every 8 (eight) hours as needed for muscle spasms.  Dispense: 30 tablet; Refill: 1  6. Stage 3a chronic kidney disease - CMP14+EGFR  7. Obesity (BMI 35.0-39.9 without comorbidity) - Encouraged diet and exercise. Education provided on obesity. Exercise goal is 30 minutes five days a week.    Follow-up: Return in about 4 months (around 05/10/2019) for follow-up of chronic medication conditions.   Hendricks Limes, MSN, APRN, FNP-C [facility]  Subjective:  Patient ID: RAKIA FRAYNE, female    DOB: 12/08/1970  Age: 48 y.o. MRN: [id number]  Patient Care Team: Loman Brooklyn, FNP as PCP - General (Family Medicine)  CC:  Chief Complaint  Patient presents with  . New Patient (Initial Visit)    HPI [place] presents to establish care. She is  transferring care from Dr. Murrell Redden office as he has retired and the office has closed.   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   She has concerns that she is overweight. She is not dieting or exercising.   Patient reports back cramping at night that has been going on for about a week with the changing of the weather. She has tried taking Ibuprofen which has not been effective.    Review of Systems  Constitutional: Negative for chills, fever, malaise/fatigue and weight loss.  HENT: Negative for congestion, ear discharge, ear pain, nosebleeds, sinus pain, sore throat and tinnitus.   Eyes: Negative for blurred vision, double vision, pain, discharge and redness.  Respiratory: Negative for cough, shortness of breath and wheezing.   Cardiovascular: Negative for chest pain, palpitations and leg swelling.  Gastrointestinal: Negative for abdominal pain, constipation, diarrhea, heartburn, nausea and vomiting.  Genitourinary: Negative for dysuria, frequency and urgency.  Musculoskeletal: Positive for back pain. Negative for myalgias.  Skin: Negative for rash.  Neurological: Negative for dizziness, seizures, weakness and headaches.  Psychiatric/Behavioral: Negative for depression, substance abuse and suicidal ideas. The patient is not nervous/anxious.     Current Outpatient Medications:  .  amLODipine (NORVASC) 10 MG tablet, Take 1 tablet (10 mg total) by mouth daily., Disp: 90 tablet, Rfl: 3 .  esomeprazole (NEXIUM) 40 MG capsule, Take 1 capsule (40 mg total) by mouth daily at 12 noon., Disp: 90 capsule, Rfl: 3 .  fenofibrate 160 MG tablet, Take 1 tablet (160 mg total)  by mouth daily., Disp: 90 tablet, Rfl: 3 .  fexofenadine (ALLEGRA) 30 MG tablet, Take 1 tablet by mouth daily., Disp: , Rfl:  .  montelukast (SINGULAIR) 10 MG tablet, Take 1 tablet (10 mg total) by mouth at bedtime., Disp: 90 tablet, Rfl: 3 .  hydrochlorothiazide (MICROZIDE) 12.5 MG capsule, Take 1 capsule (12.5 mg total)  by mouth daily., Disp: 90 capsule, Rfl: 3 .  ibuprofen (ADVIL,MOTRIN) 200 MG tablet, Take 600-800 mg by mouth every 4 (four) hours as needed., Disp: , Rfl:  .  methocarbamol (ROBAXIN) 500 MG tablet, Take 1-2 tablets (500-1,000 mg total) by mouth every 8 (eight) hours as needed for muscle spasms., Disp: 30 tablet, Rfl: 1  Allergies  Allergen Reactions  . Vancomycin Hives and Other (See Comments)    Past Medical History:  Diagnosis Date  . Arthritis   . Gallstones   . GERD (gastroesophageal reflux disease)   . HLD (hyperlipidemia)   . Hypertension   . Seasonal allergies     Past Surgical History:  Procedure Laterality Date  . CARPOMETACARPEL SUSPENSION PLASTY Right 04/27/2014   Procedure: RIGHT THUMB CARPOMETACARPAL ARTHROPLASTY  [name] ;  Surgeon: Leanora Cover, MD;  Location: [facility];  Service: Orthopedics;  Laterality: Right;  . CESAREAN SECTION  02,95  . CHOLECYSTECTOMY  1997  . COLON SURGERY  08/01/2015   x6, one with appendectomy  . [place] SURGERY  2009  . LUMBAR FUSION  2011  . PARTIAL HYMENECTOMY  2003  . TONSILLECTOMY    . TUBAL LIGATION      Family History  Problem Relation Age of Onset  . Heart attack Father 17       X2  . Diabetes Father   . Heart disease Father   . Colon cancer Mother 48  . Asthma Sister   . Diabetes Sister   . Heart disease Sister   . Heart attack Paternal Grandfather   . Lung cancer Maternal Grandfather   . Other Paternal Grandmother        stomach swelling    Social History   Socioeconomic History  . Marital status: Married    Spouse name: Not on file  . Number of children: 2  . Years of education: Not on file  . Highest education level: Not on file  Occupational History  . 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  Social Needs  . Financial resource strain: Not on file  . Food insecurity    Worry: Not on file    Inability: Not on file  . Transportation needs    Medical: Not on file    Non-medical: Not on  file  Tobacco Use  . Smoking status: Current Every Day Smoker    Packs/day: 0.25    Years: 25.00    Pack years: 6.25    Types: Cigarettes  . Smokeless tobacco: Never Used  Substance and Sexual Activity  . Alcohol use: No    Alcohol/week: 0.0 standard drinks  . Drug use: No  . Sexual activity: Yes    Birth control/protection: Surgical  Lifestyle  . Physical activity    Days per week: Not on file    Minutes per session: Not on file  . Stress: Not on file  Relationships  . Social Herbalist on phone: Not on file    Gets together: Not on file    Attends religious service: Not on file    Active member of club or organization: Not on file  Attends meetings of clubs or organizations: Not on file    Relationship status: Not on file  . Intimate partner violence    Fear of current or ex partner: Not on file    Emotionally abused: Not on file    Physically abused: Not on file    Forced sexual activity: Not on file  Other Topics Concern  . Not on file  Social History Narrative  . Not on file    Objective:   Today's Vitals: BP 114/80   Pulse 83   Temp 98.4 F (36.9 C) (Temporal)   Ht 5' 7.5" (1.715 m)   Wt 245 lb (111.1 kg)   SpO2 99%   BMI 37.81 kg/m   Physical Exam Vitals signs reviewed.  Constitutional:      General: She is not in acute distress.    Appearance: Normal appearance. She is obese. She is not ill-appearing, toxic-appearing or diaphoretic.  HENT:     Head: Normocephalic and atraumatic.     Right Ear: Tympanic membrane, ear canal and external ear normal. There is no impacted cerumen.     Left Ear: Tympanic membrane, ear canal and external ear normal. There is no impacted cerumen.     Nose: Nose normal. No congestion or rhinorrhea.     Mouth/Throat:     Mouth: Mucous membranes are moist.     Pharynx: Oropharynx is clear. No oropharyngeal exudate or posterior oropharyngeal erythema.  Eyes:     General: No scleral icterus.       Right eye: No  discharge.        Left eye: No discharge.     Conjunctiva/sclera: Conjunctivae normal.     Pupils: Pupils are equal, round, and reactive to light.  Neck:     Musculoskeletal: Normal range of motion.  Cardiovascular:     Rate and Rhythm: Normal rate and regular rhythm.     Heart sounds: Normal heart sounds. No murmur. No friction rub. No gallop.   Pulmonary:     Effort: Pulmonary effort is normal. No respiratory distress.     Breath sounds: Normal breath sounds. No stridor. No wheezing, rhonchi or rales.  Musculoskeletal: Normal range of motion.  Skin:    General: Skin is warm and dry.     Capillary Refill: Capillary refill takes less than 2 seconds.  Neurological:     General: No focal deficit present.     Mental Status: She is alert and oriented to person, place, and time. Mental status is at baseline.  Psychiatric:        Mood and Affect: Mood normal.        Behavior: Behavior normal.        Thought Content: Thought content normal.        Judgment: Judgment normal.

## 2019-01-14 ENCOUNTER — Encounter: Payer: Self-pay | Admitting: Family Medicine

## 2019-05-11 ENCOUNTER — Telehealth: Payer: Self-pay | Admitting: Family Medicine

## 2019-05-11 NOTE — Patient Instructions (Addendum)
Vitamin C Cream for scars.   High Cholesterol  High cholesterol is a condition in which the blood has high levels of a white, waxy, fat-like substance (cholesterol). The human body needs small amounts of cholesterol. The liver makes all the cholesterol that the body needs. Extra (excess) cholesterol comes from the food that we eat. Cholesterol is carried from the liver by the blood through the blood vessels. If you have high cholesterol, deposits (plaques) may build up on the walls of your blood vessels (arteries). Plaques make the arteries narrower and stiffer. Cholesterol plaques increase your risk for heart attack and stroke. Work with your health care provider to keep your cholesterol levels in a healthy range. What increases the risk? This condition is more likely to develop in people who:  Eat foods that are high in animal fat (saturated fat) or cholesterol.  Are overweight.  Are not getting enough exercise.  Have a family history of high cholesterol. What are the signs or symptoms? There are no symptoms of this condition. How is this diagnosed? This condition may be diagnosed from the results of a blood test.  If you are older than age 65, your health care provider may check your cholesterol every 4-6 years.  You may be checked more often if you already have high cholesterol or other risk factors for heart disease. The blood test for cholesterol measures:  "Bad" cholesterol (LDL cholesterol). This is the main type of cholesterol that causes heart disease. The desired level for LDL is less than 100.  "Good" cholesterol (HDL cholesterol). This type helps to protect against heart disease by cleaning the arteries and carrying the LDL away. The desired level for HDL is 60 or higher.  Triglycerides. These are fats that the body can store or burn for energy. The desired number for triglycerides is lower than 150.  Total cholesterol. This is a measure of the total amount of cholesterol  in your blood, including LDL cholesterol, HDL cholesterol, and triglycerides. A healthy number is less than 200. How is this treated? This condition is treated with diet changes, lifestyle changes, and medicines. Diet changes  This may include eating more whole grains, fruits, vegetables, nuts, and fish.  This may also include cutting back on red meat and foods that have a lot of added sugar. Lifestyle changes  Changes may include getting at least 40 minutes of aerobic exercise 3 times a week. Aerobic exercises include walking, biking, and swimming. Aerobic exercise along with a healthy diet can help you maintain a healthy weight.  Changes may also include quitting smoking. Medicines  Medicines are usually given if diet and lifestyle changes have failed to reduce your cholesterol to healthy levels.  Your health care provider may prescribe a statin medicine. Statin medicines have been shown to reduce cholesterol, which can reduce the risk of heart disease. Follow these instructions at home: Eating and drinking If told by your health care provider:  Eat chicken (without skin), fish, veal, shellfish, ground [place] breast, and round or loin cuts of red meat.  Do not eat fried foods or fatty meats, such as hot dogs and salami.  Eat plenty of fruits, such as apples.  Eat plenty of vegetables, such as broccoli, potatoes, and carrots.  Eat beans, peas, and lentils.  Eat grains such as barley, rice, couscous, and bulgur wheat.  Eat pasta without cream sauces.  Use skim or nonfat milk, and eat low-fat or nonfat yogurt and cheeses.  Do not eat or drink  whole milk, cream, ice cream, egg yolks, or hard cheeses.  Do not eat stick margarine or tub margarines that contain trans fats (also called partially hydrogenated oils).  Do not eat saturated tropical oils, such as coconut oil and palm oil.  Do not eat cakes, cookies, crackers, or other baked goods that contain trans fats.  General  instructions  Exercise as directed by your health care provider. Increase your activity level with activities such as gardening, walking, and taking the stairs.  Take over-the-counter and prescription medicines only as told by your health care provider.  Do not use any products that contain nicotine or tobacco, such as cigarettes and e-cigarettes. If you need help quitting, ask your health care provider.  Keep all follow-up visits as told by your health care provider. This is important. Contact a health care provider if:  You are struggling to maintain a healthy diet or weight.  You need help to start on an exercise program.  You need help to stop smoking. Get help right away if:  You have chest pain.  You have trouble breathing. This information is not intended to replace advice given to you by your health care provider. Make sure you discuss any questions you have with your health care provider. Document Revised: 03/15/2017 Document Reviewed: 09/10/2015 Elsevier Patient Education  [place].

## 2019-05-11 NOTE — Progress Notes (Signed)
Assessment & Plan:  1. Essential hypertension - Well controlled on current regimen.  - CMP14+EGFR - Lipid panel  2. Chronic allergic rhinitis - Well controlled on current regimen.   3. Gastroesophageal reflux disease without esophagitis - Well controlled on current regimen.  - CMP14+EGFR  4. Stage 3a chronic kidney disease - CMP14+EGFR  5. Mixed hyperlipidemia - Elevated. Encouraged diet and exercise to reduce numbers.  Education provided on high cholesterol. - CMP14+EGFR - Lipid panel  6. Obesity (BMI 35.0-39.9 without comorbidity) - Encouraged diet and exercise.  7. Pain in surgical scar - Discussed a referral for treatments such as injections or laser therapy but patient does not wish to do this as she does not feel it bothers her that much.  Encouraged her to apply vitamin C cream to her scars on a regular basis.   Return in about 4 months (around 09/09/2019) for follow-up of chronic medication conditions.  Hendricks Limes, MSN, APRN, FNP-C [facility]  Subjective:    Patient ID: Jamie Clay, female    DOB: [birth year], 49 y.o.   MRN: [id number]  Patient Care Team: Loman Brooklyn, FNP as PCP - General (Family Medicine)   Chief Complaint:  Chief Complaint  Patient presents with  . Hypertension    4 month follow up    HPI: Jamie Clay is a 49 y.o. female presenting on 05/12/2019 for Hypertension (4 month follow up)  When patient establish care with me on 01/07/2019 she was concerned about being overweight.  At that time she was not dieting or exercising.  She was encouraged to work on her diet and start exercising with a goal of 30 minutes five days a week.  Today she reports she has not done anything that was previously recommended.  She is no longer caring for her aunt as she has passed away.  She is now trying to clean out her house and reports she was a very bad hoarder.   New complaints: Patient's concern today is regarding  pain that she sometimes has in the scars on her abdomen.  Patient has had extensive stomach surgeries which left extensive scarring.   Social history:  Relevant past medical, surgical, family and social history reviewed and updated as indicated. Interim medical history since our last visit reviewed.  Allergies and medications reviewed and updated.  DATA REVIEWED: CHART IN EPIC  ROS: Negative unless specifically indicated above in HPI.    Current Outpatient Medications:  .  amLODipine (NORVASC) 10 MG tablet, Take 1 tablet (10 mg total) by mouth daily., Disp: 90 tablet, Rfl: 3 .  esomeprazole (NEXIUM) 40 MG capsule, Take 1 capsule (40 mg total) by mouth daily at 12 noon., Disp: 90 capsule, Rfl: 3 .  fenofibrate 160 MG tablet, Take 1 tablet (160 mg total) by mouth daily., Disp: 90 tablet, Rfl: 3 .  hydrochlorothiazide (MICROZIDE) 12.5 MG capsule, Take 1 capsule (12.5 mg total) by mouth daily., Disp: 90 capsule, Rfl: 3 .  ibuprofen (ADVIL,MOTRIN) 200 MG tablet, Take 600-800 mg by mouth every 4 (four) hours as needed., Disp: , Rfl:  .  methocarbamol (ROBAXIN) 500 MG tablet, Take 1-2 tablets (500-1,000 mg total) by mouth every 8 (eight) hours as needed for muscle spasms., Disp: 30 tablet, Rfl: 1 .  montelukast (SINGULAIR) 10 MG tablet, Take 1 tablet (10 mg total) by mouth at bedtime., Disp: 90 tablet, Rfl: 3 .  fexofenadine (ALLEGRA) 30 MG tablet, Take 1 tablet by mouth daily., Disp: ,  Rfl:    Allergies  Allergen Reactions  . Vancomycin Hives and Other (See Comments)   Past Medical History:  Diagnosis Date  . Arthritis   . Gallstones   . GERD (gastroesophageal reflux disease)   . HLD (hyperlipidemia)   . Hypertension   . Seasonal allergies     Past Surgical History:  Procedure Laterality Date  . ABDOMINAL HYSTERECTOMY    . CARPOMETACARPEL SUSPENSION PLASTY Right 04/27/2014   Procedure: RIGHT THUMB CARPOMETACARPAL ARTHROPLASTY  [name] ;  Surgeon: Leanora Cover, MD;  Location:  [facility];  Service: Orthopedics;  Laterality: Right;  . CESAREAN SECTION  02,95  . CHOLECYSTECTOMY  1997  . COLON SURGERY  08/01/2015   x6, one with appendectomy  . [place] SURGERY  2009  . LUMBAR FUSION  2011  . PARTIAL HYMENECTOMY  2003  . TONSILLECTOMY    . TUBAL LIGATION      Social History   Socioeconomic History  . Marital status: Married    Spouse name: Not on file  . Number of children: 2  . Years of education: Not on file  . Highest education level: Not on file  Occupational History  . 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  Tobacco Use  . Smoking status: Current Every Day Smoker    Packs/day: 0.25    Years: 25.00    Pack years: 6.25    Types: Cigarettes  . Smokeless tobacco: Never Used  Substance and Sexual Activity  . Alcohol use: No    Alcohol/week: 0.0 standard drinks  . Drug use: No  . Sexual activity: Yes    Birth control/protection: Surgical  Other Topics Concern  . Not on file  Social History Narrative  . Not on file   Social Determinants of Health   Financial Resource Strain:   . Difficulty of Paying Living Expenses: Not on file  Food Insecurity:   . Worried About Charity fundraiser in the Last Year: Not on file  . Ran Out of Food in the Last Year: Not on file  Transportation Needs:   . Lack of Transportation (Medical): Not on file  . Lack of Transportation (Non-Medical): Not on file  Physical Activity:   . Days of Exercise per Week: Not on file  . Minutes of Exercise per Session: Not on file  Stress:   . Feeling of Stress : Not on file  Social Connections:   . Frequency of Communication with Friends and Family: Not on file  . Frequency of Social Gatherings with Friends and Family: Not on file  . Attends Religious Services: Not on file  . Active Member of Clubs or Organizations: Not on file  . Attends Archivist Meetings: Not on file  . Marital Status: Not on file  Intimate Partner Violence:   . Fear of Current or  Ex-Partner: Not on file  . Emotionally Abused: Not on file  . Physically Abused: Not on file  . Sexually Abused: Not on file        Objective:    BP 122/71   Pulse 96   Temp 98.6 F (37 C) (Temporal)   Ht 5' 7.5" (1.715 m)   Wt 253 lb (114.8 kg)   SpO2 100%   BMI 39.04 kg/m   Physical Exam Vitals reviewed.  Constitutional:      General: She is not in acute distress.    Appearance: Normal appearance. She is obese. She is not ill-appearing, toxic-appearing or diaphoretic.  HENT:  Head: Normocephalic and atraumatic.  Eyes:     General: No scleral icterus.       Right eye: No discharge.        Left eye: No discharge.     Conjunctiva/sclera: Conjunctivae normal.  Cardiovascular:     Rate and Rhythm: Normal rate and regular rhythm.     Heart sounds: Normal heart sounds. No murmur. No friction rub. No gallop.   Pulmonary:     Effort: Pulmonary effort is normal. No respiratory distress.     Breath sounds: Normal breath sounds. No stridor. No wheezing, rhonchi or rales.  Musculoskeletal:        General: Normal range of motion.     Cervical back: Normal range of motion.  Skin:    General: Skin is warm and dry.     Capillary Refill: Capillary refill takes less than 2 seconds.     Comments: Large scarring to abdomen.   Neurological:     General: No focal deficit present.     Mental Status: She is alert and oriented to person, place, and time. Mental status is at baseline.  Psychiatric:        Mood and Affect: Mood normal.        Behavior: Behavior normal.        Thought Content: Thought content normal.        Judgment: Judgment normal.     No results found for: TSH Lab Results  Component Value Date   WBC 6.7 12/02/2009   HGB 15.1 (H) 04/27/2014   HCT 43.3 12/02/2009   MCV 92.1 12/02/2009   PLT 242 12/02/2009   Lab Results  Component Value Date   NA 141 01/07/2019   K 4.3 01/07/2019   CO2 21 01/07/2019   GLUCOSE 104 (H) 01/07/2019   BUN 19 01/07/2019    CREATININE 1.20 (H) 01/07/2019   BILITOT 0.4 01/07/2019   ALKPHOS 43 01/07/2019   AST 16 01/07/2019   ALT 26 01/07/2019   PROT 6.2 01/07/2019   ALBUMIN 4.1 01/07/2019   CALCIUM 9.4 01/07/2019   ANIONGAP 6 04/26/2014   Lab Results  Component Value Date   CHOL 211 (H) 01/07/2019   Lab Results  Component Value Date   HDL 29 (L) 01/07/2019   Lab Results  Component Value Date   LDLCALC 144 (H) 01/07/2019   Lab Results  Component Value Date   TRIG 208 (H) 01/07/2019   Lab Results  Component Value Date   CHOLHDL 7.3 (H) 01/07/2019   No results found for: HGBA1C

## 2019-05-12 ENCOUNTER — Ambulatory Visit: Payer: 59 | Admitting: Family Medicine

## 2019-05-12 ENCOUNTER — Other Ambulatory Visit: Payer: Self-pay

## 2019-05-12 ENCOUNTER — Encounter: Payer: Self-pay | Admitting: Family Medicine

## 2019-05-12 VITALS — BP 122/71 | HR 96 | Temp 98.6°F | Ht 67.5 in | Wt 253.0 lb

## 2019-05-12 DIAGNOSIS — J309 Allergic rhinitis, unspecified: Secondary | ICD-10-CM | POA: Diagnosis not present

## 2019-05-12 DIAGNOSIS — E782 Mixed hyperlipidemia: Secondary | ICD-10-CM

## 2019-05-12 DIAGNOSIS — N1831 Chronic kidney disease, stage 3a: Secondary | ICD-10-CM

## 2019-05-12 DIAGNOSIS — R52 Pain, unspecified: Secondary | ICD-10-CM

## 2019-05-12 DIAGNOSIS — L905 Scar conditions and fibrosis of skin: Secondary | ICD-10-CM

## 2019-05-12 DIAGNOSIS — E669 Obesity, unspecified: Secondary | ICD-10-CM

## 2019-05-12 DIAGNOSIS — K219 Gastro-esophageal reflux disease without esophagitis: Secondary | ICD-10-CM

## 2019-05-12 DIAGNOSIS — I10 Essential (primary) hypertension: Secondary | ICD-10-CM

## 2019-05-13 LAB — CMP14+EGFR
ALT: 36 IU/L — ABNORMAL HIGH (ref 0–32)
AST: 25 IU/L (ref 0–40)
Albumin/Globulin Ratio: 2.1 (ref 1.2–2.2)
Albumin: 4.4 g/dL (ref 3.8–4.8)
Alkaline Phosphatase: 39 IU/L (ref 39–117)
BUN/Creatinine Ratio: 12 (ref 9–23)
BUN: 16 mg/dL (ref 6–24)
Bilirubin Total: 0.3 mg/dL (ref 0.0–1.2)
CO2: 20 mmol/L (ref 20–29)
Calcium: 9.8 mg/dL (ref 8.7–10.2)
Chloride: 103 mmol/L (ref 96–106)
Creatinine, Ser: 1.36 mg/dL — ABNORMAL HIGH (ref 0.57–1.00)
GFR calc Af Amer: 53 mL/min/{1.73_m2} — ABNORMAL LOW (ref >59)
GFR calc non Af Amer: 46 mL/min/{1.73_m2} — ABNORMAL LOW (ref >59)
Globulin, Total: 2.1 g/dL (ref 1.5–4.5)
Glucose: 111 mg/dL — ABNORMAL HIGH (ref 65–99)
Potassium: 4.2 mmol/L (ref 3.5–5.2)
Sodium: 139 mmol/L (ref 134–144)
Total Protein: 6.5 g/dL (ref 6.0–8.5)

## 2019-05-13 LAB — LIPID PANEL
Chol/HDL Ratio: 6.3 ratio — ABNORMAL HIGH (ref 0.0–4.4)
Cholesterol, Total: 196 mg/dL (ref 100–199)
HDL: 31 mg/dL — ABNORMAL LOW (ref >39)
LDL Chol Calc (NIH): 137 mg/dL — ABNORMAL HIGH (ref 0–99)
Triglycerides: 156 mg/dL — ABNORMAL HIGH (ref 0–149)
VLDL Cholesterol Cal: 28 mg/dL (ref 5–40)

## 2019-06-19 ENCOUNTER — Encounter (HOSPITAL_COMMUNITY): Payer: Self-pay | Admitting: Pediatrics

## 2019-06-19 ENCOUNTER — Emergency Department (HOSPITAL_COMMUNITY)
Admission: EM | Admit: 2019-06-19 | Discharge: 2019-06-19 | Disposition: A | Discharge status: Home / Self Care | Payer: 59 | Attending: Emergency Medicine | Admitting: Emergency Medicine

## 2019-06-19 ENCOUNTER — Other Ambulatory Visit: Payer: Self-pay

## 2019-06-19 DIAGNOSIS — G8918 Other acute postprocedural pain: Secondary | ICD-10-CM | POA: Insufficient documentation

## 2019-06-19 DIAGNOSIS — Z5321 Procedure and treatment not carried out due to patient leaving prior to being seen by health care provider: Secondary | ICD-10-CM | POA: Insufficient documentation

## 2019-06-19 LAB — CBC
HCT: 45.1 % (ref 36.0–46.0)
Hemoglobin: 14.4 g/dL (ref 12.0–15.0)
MCH: 26.6 pg (ref 26.0–34.0)
MCHC: 31.9 g/dL (ref 30.0–36.0)
MCV: 83.4 fL (ref 80.0–100.0)
Platelets: 339 10*3/uL (ref 150–400)
RBC: 5.41 MIL/uL — ABNORMAL HIGH (ref 3.87–5.11)
RDW: 14.7 % (ref 11.5–15.5)
WBC: 10.5 10*3/uL (ref 4.0–10.5)
nRBC: 0 % (ref 0.0–0.2)

## 2019-06-19 LAB — LACTIC ACID, PLASMA: Lactic Acid, Venous: 1.3 mmol/L (ref 0.5–1.9)

## 2019-06-19 LAB — COMPREHENSIVE METABOLIC PANEL
ALT: 33 U/L (ref 0–44)
AST: 27 U/L (ref 15–41)
Albumin: 4.3 g/dL (ref 3.5–5.0)
Alkaline Phosphatase: 33 U/L — ABNORMAL LOW (ref 38–126)
Anion gap: 14 (ref 5–15)
BUN: 17 mg/dL (ref 6–20)
CO2: 17 mmol/L — ABNORMAL LOW (ref 22–32)
Calcium: 9.2 mg/dL (ref 8.9–10.3)
Chloride: 105 mmol/L (ref 98–111)
Creatinine, Ser: 1.45 mg/dL — ABNORMAL HIGH (ref 0.44–1.00)
GFR calc Af Amer: 49 mL/min — ABNORMAL LOW (ref >60)
GFR calc non Af Amer: 42 mL/min — ABNORMAL LOW (ref >60)
Glucose, Bld: 133 mg/dL — ABNORMAL HIGH (ref 70–99)
Potassium: 3.4 mmol/L — ABNORMAL LOW (ref 3.5–5.1)
Sodium: 136 mmol/L (ref 135–145)
Total Bilirubin: 0.8 mg/dL (ref 0.3–1.2)
Total Protein: 7.2 g/dL (ref 6.5–8.1)

## 2019-06-19 LAB — LIPASE, BLOOD: Lipase: 23 U/L (ref 11–51)

## 2019-06-19 MED ORDER — SODIUM CHLORIDE 0.9% FLUSH: 3.0000 mL | Freq: Once | INTRAVENOUS | Status: DC

## 2019-06-19 NOTE — ED Notes (Signed)
The pt came with abd pain  While she waited she reports that her pain wsent away after she had a bm  She is leaving because the pain went away

## 2019-06-19 NOTE — ED Triage Notes (Signed)
Patient reported has had multiple abdominal surgeries at [facility] the last 6 months d/t bowel perforation and colostomy bag placement. Patient stated last surgery on 03/04/2019.

## 2019-06-22 ENCOUNTER — Telehealth: Payer: Self-pay | Admitting: Family Medicine

## 2019-06-22 NOTE — Telephone Encounter (Signed)
Patient needs appointment for ER follow up.  Appointment scheduled 06/24/2019 at 8:05 am with Deliah Boston.

## 2019-06-24 ENCOUNTER — Encounter: Payer: Self-pay | Admitting: Family Medicine

## 2019-06-24 ENCOUNTER — Ambulatory Visit (INDEPENDENT_AMBULATORY_CARE_PROVIDER_SITE_OTHER): Payer: 59 | Admitting: Family Medicine

## 2019-06-24 DIAGNOSIS — K5900 Constipation, unspecified: Secondary | ICD-10-CM | POA: Diagnosis not present

## 2019-06-24 LAB — CULTURE, BLOOD (ROUTINE X 2)
Culture: NO GROWTH
Special Requests: ADEQUATE

## 2019-06-24 NOTE — Progress Notes (Signed)
Virtual Visit via Telephone Note  I connected with Jamie Clay on 06/24/19 at 8:15 AM by telephone and verified that I am speaking with the correct person using two identifiers. Jamie Clay is currently located at home and nobody is currently with her during this visit. The provider, Gwenlyn Fudge, FNP is located in their home at time of visit.  I discussed the limitations, risks, security and privacy concerns of performing an evaluation and management service by telephone and the availability of in person appointments. I also discussed with the patient that there may be a patient responsible charge related to this service. The patient expressed understanding and agreed to proceed.  Subjective: PCP: Gwenlyn Fudge, FNP  Chief Complaint  Patient presents with  . Hospitalization Follow-up   Patient reports she went to the hospital as she knew she had a bowel blockage as this has happened to her before. She has got to the point where she was vomiting. She had tried taking ex-lax and stool softeners at home. After sitting in the ER for 2.5 hours waiting to be seen she had a bowel movement and decided to go back home. She had been using Miralax daily and had tried to decrease to every other day prior to this incident.    ROS: Per HPI  Current Outpatient Medications:  .  amLODipine (NORVASC) 10 MG tablet, Take 1 tablet (10 mg total) by mouth daily., Disp: 90 tablet, Rfl: 3 .  esomeprazole (NEXIUM) 40 MG capsule, Take 1 capsule (40 mg total) by mouth daily at 12 noon., Disp: 90 capsule, Rfl: 3 .  fenofibrate 160 MG tablet, Take 1 tablet (160 mg total) by mouth daily., Disp: 90 tablet, Rfl: 3 .  fexofenadine (ALLEGRA) 30 MG tablet, Take 1 tablet by mouth daily., Disp: , Rfl:  .  hydrochlorothiazide (MICROZIDE) 12.5 MG capsule, Take 1 capsule (12.5 mg total) by mouth daily., Disp: 90 capsule, Rfl: 3 .  ibuprofen (ADVIL,MOTRIN) 200 MG tablet, Take 600-800 mg by mouth every 4 (four) hours  as needed., Disp: , Rfl:  .  methocarbamol (ROBAXIN) 500 MG tablet, Take 1-2 tablets (500-1,000 mg total) by mouth every 8 (eight) hours as needed for muscle spasms., Disp: 30 tablet, Rfl: 1 .  montelukast (SINGULAIR) 10 MG tablet, Take 1 tablet (10 mg total) by mouth at bedtime., Disp: 90 tablet, Rfl: 3  Allergies  Allergen Reactions  . Vancomycin Hives and Other (See Comments)   Past Medical History:  Diagnosis Date  . Arthritis   . Gallstones   . GERD (gastroesophageal reflux disease)   . HLD (hyperlipidemia)   . Hypertension   . Seasonal allergies     Observations/Objective: A&O  No respiratory distress or wheezing audible over the phone Mood, judgement, and thought processes all WNL  Assessment and Plan: 1. Constipation, unspecified constipation type - Patient to continue Miralax daily since it does keep her regular. Her last colonoscopy was in 2017. We discussed that should she have any further issues, I want her to let me know so I can refer her to a gastroenterologist given everything she has been through with her abdomen.    Follow Up Instructions:  I discussed the assessment and treatment plan with the patient. The patient was provided an opportunity to ask questions and all were answered. The patient agreed with the plan and demonstrated an understanding of the instructions.   The patient was advised to call back or seek an in-person evaluation if the  symptoms worsen or if the condition fails to improve as anticipated.  The above assessment and management plan was discussed with the patient. The patient verbalized understanding of and has agreed to the management plan. Patient is aware to call the clinic if symptoms persist or worsen. Patient is aware when to return to the clinic for a follow-up visit. Patient educated on when it is appropriate to go to the emergency department.   Time call ended: 8:27 AM  I provided 14 minutes of non-face-to-face time during this  encounter.  Hendricks Limes, MSN, APRN, FNP-C [facility] 06/24/19

## 2019-09-09 ENCOUNTER — Encounter: Payer: Self-pay | Admitting: Family Medicine

## 2019-09-09 ENCOUNTER — Other Ambulatory Visit: Payer: Self-pay

## 2019-09-09 ENCOUNTER — Ambulatory Visit (INDEPENDENT_AMBULATORY_CARE_PROVIDER_SITE_OTHER): Payer: 59 | Admitting: Family Medicine

## 2019-09-09 VITALS — BP 119/75 | HR 86 | Temp 98.0°F | Ht 67.0 in | Wt 253.6 lb

## 2019-09-09 DIAGNOSIS — N1831 Chronic kidney disease, stage 3a: Secondary | ICD-10-CM | POA: Diagnosis not present

## 2019-09-09 DIAGNOSIS — J309 Allergic rhinitis, unspecified: Secondary | ICD-10-CM

## 2019-09-09 DIAGNOSIS — Z23 Encounter for immunization: Secondary | ICD-10-CM

## 2019-09-09 DIAGNOSIS — E782 Mixed hyperlipidemia: Secondary | ICD-10-CM | POA: Diagnosis not present

## 2019-09-09 DIAGNOSIS — I10 Essential (primary) hypertension: Secondary | ICD-10-CM | POA: Diagnosis not present

## 2019-09-09 DIAGNOSIS — K219 Gastro-esophageal reflux disease without esophagitis: Secondary | ICD-10-CM

## 2019-09-09 MED ORDER — SHINGRIX 50 MCG/0.5ML IM SUSR: 0.5000 mL | Freq: Once | INTRAMUSCULAR | 0 refills | Status: AC

## 2019-09-09 NOTE — Progress Notes (Signed)
Assessment & Plan:  1. Essential hypertension - Well controlled on current regimen.  - CBC with Differential/Platelet - CMP14+EGFR - Lipid panel  2. Mixed hyperlipidemia - Well controlled on current regimen.  - CMP14+EGFR - Lipid panel  3. Stage 3a chronic kidney disease - Discussed if kidney function declines further, I will refer to nephrology. Avoid Ibuprofen, Advil, Aleve, Motrin, Goody Powders, Naproxen, BC powders, Meloxicam, Diclofenac, Indomethacin and other nonsteroidal anti-inflammatory medications (NSAIDs). - CMP14+EGFR  4. Gastroesophageal reflux disease without esophagitis - Well controlled on current regimen.   5. Chronic allergic rhinitis - Well controlled on current regimen.   6. Immunization due - SHINGRIX injection; Inject 0.5 mLs into the muscle once for 1 dose.  Dispense: 0.5 mL; Refill: 0 (sent to pharmacy for administration)  Record release signed for colonoscopy from [facility].    Return in about 4 months (around 01/09/2020) for follow-up of chronic medication conditions.  Hendricks Limes, MSN, APRN, FNP-C [facility]  Subjective:    Patient ID: Jamie Clay, female    DOB: 1970-05-17, 49 y.o.   MRN: [id number]  Patient Care Team: Loman Brooklyn, FNP as PCP - General (Family Medicine)   Chief Complaint:  Chief Complaint  Patient presents with  . Hypertension    check up of chronic medical conditions    HPI: Jamie Clay is a 49 y.o. female presenting on 09/09/2019 for Hypertension (check up of chronic medical conditions)  Patient here for a follow-up of hypertension, hyperlipidemia, CKD, GERD, and allergies.  No complaints or concerns.   New complaints: None  Social history:  Relevant past medical, surgical, family and social history reviewed and updated as indicated. Interim medical history since our last visit reviewed.  Allergies and medications reviewed and updated.  DATA REVIEWED: CHART IN  EPIC  ROS: Negative unless specifically indicated above in HPI.    Current Outpatient Medications:  .  amLODipine (NORVASC) 10 MG tablet, Take 1 tablet (10 mg total) by mouth daily., Disp: 90 tablet, Rfl: 3 .  esomeprazole (NEXIUM) 40 MG capsule, Take 1 capsule (40 mg total) by mouth daily at 12 noon., Disp: 90 capsule, Rfl: 3 .  fenofibrate 160 MG tablet, Take 1 tablet (160 mg total) by mouth daily., Disp: 90 tablet, Rfl: 3 .  hydrochlorothiazide (MICROZIDE) 12.5 MG capsule, Take 1 capsule (12.5 mg total) by mouth daily., Disp: 90 capsule, Rfl: 3 .  methocarbamol (ROBAXIN) 500 MG tablet, Take 1-2 tablets (500-1,000 mg total) by mouth every 8 (eight) hours as needed for muscle spasms., Disp: 30 tablet, Rfl: 1 .  montelukast (SINGULAIR) 10 MG tablet, Take 1 tablet (10 mg total) by mouth at bedtime., Disp: 90 tablet, Rfl: 3 .  fexofenadine (ALLEGRA) 30 MG tablet, Take 1 tablet by mouth daily., Disp: , Rfl:  .  SHINGRIX injection, Inject 0.5 mLs into the muscle once for 1 dose., Disp: 0.5 mL, Rfl: 0   Allergies  Allergen Reactions  . Vancomycin Hives and Other (See Comments)   Past Medical History:  Diagnosis Date  . Arthritis   . Gallstones   . GERD (gastroesophageal reflux disease)   . HLD (hyperlipidemia)   . Hypertension   . Seasonal allergies     Past Surgical History:  Procedure Laterality Date  . ABDOMINAL HYSTERECTOMY    . CARPOMETACARPEL SUSPENSION PLASTY Right 04/27/2014   Procedure: RIGHT THUMB CARPOMETACARPAL ARTHROPLASTY  [name] ;  Surgeon: Leanora Cover, MD;  Location: [place];  Service: Orthopedics;  Laterality: Right;  . CESAREAN SECTION  02,95  . CHOLECYSTECTOMY  1997  . COLON SURGERY  08/01/2015   x6, one with appendectomy  . [place] SURGERY  2009  . LUMBAR FUSION  2011  . PARTIAL HYMENECTOMY  2003  . TONSILLECTOMY    . TUBAL LIGATION      Social History   Socioeconomic History  . Marital status: Married    Spouse name: Not on file   . Number of children: 2  . Years of education: Not on file  . Highest education level: Not on file  Occupational History  . 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  Tobacco Use  . Smoking status: Current Every Day Smoker    Packs/day: 0.25    Years: 25.00    Pack years: 6.25    Types: Cigarettes  . Smokeless tobacco: Never Used  Substance and Sexual Activity  . Alcohol use: No    Alcohol/week: 0.0 standard drinks  . Drug use: No  . Sexual activity: Yes    Birth control/protection: Surgical  Other Topics Concern  . Not on file  Social History Narrative  . Not on file   Social Determinants of Health   Financial Resource Strain:   . Difficulty of Paying Living Expenses:   Food Insecurity:   . Worried About Charity fundraiser in the Last Year:   . Arboriculturist in the Last Year:   Transportation Needs:   . Film/video editor (Medical):   Marland Kitchen Lack of Transportation (Non-Medical):   Physical Activity:   . Days of Exercise per Week:   . Minutes of Exercise per Session:   Stress:   . Feeling of Stress :   Social Connections:   . Frequency of Communication with Friends and Family:   . Frequency of Social Gatherings with Friends and Family:   . Attends Religious Services:   . Active Member of Clubs or Organizations:   . Attends Archivist Meetings:   Marland Kitchen Marital Status:   Intimate Partner Violence:   . Fear of Current or Ex-Partner:   . Emotionally Abused:   Marland Kitchen Physically Abused:   . Sexually Abused:         Objective:    BP 119/75   Pulse 86   Temp 98 F (36.7 C) (Temporal)   Ht _0  (1.702 m)   Wt 253 lb 9.6 oz (115 kg)   SpO2 98%   BMI 39.72 kg/m   Wt Readings from Last 3 Encounters:  09/09/19 253 lb 9.6 oz (115 kg)  06/19/19 250 lb (113.4 kg)  05/12/19 253 lb (114.8 kg)    Physical Exam Vitals reviewed.  Constitutional:      General: She is not in acute distress.    Appearance: Normal appearance. She is obese. She is not ill-appearing,  toxic-appearing or diaphoretic.  HENT:     Head: Normocephalic and atraumatic.  Eyes:     General: No scleral icterus.       Right eye: No discharge.        Left eye: No discharge.     Conjunctiva/sclera: Conjunctivae normal.  Cardiovascular:     Rate and Rhythm: Normal rate and regular rhythm.     Heart sounds: Normal heart sounds. No murmur heard.  No friction rub. No gallop.   Pulmonary:     Effort: Pulmonary effort is normal. No respiratory distress.     Breath sounds: Normal breath sounds. No stridor. No wheezing, rhonchi  or rales.  Musculoskeletal:        General: Normal range of motion.     Cervical back: Normal range of motion.  Skin:    General: Skin is warm and dry.     Capillary Refill: Capillary refill takes less than 2 seconds.     Comments: Large scarring to abdomen.   Neurological:     General: No focal deficit present.     Mental Status: She is alert and oriented to person, place, and time. Mental status is at baseline.  Psychiatric:        Mood and Affect: Mood normal.        Behavior: Behavior normal.        Thought Content: Thought content normal.        Judgment: Judgment normal.     No results found for: TSH Lab Results  Component Value Date   WBC 10.5 06/19/2019   HGB 14.4 06/19/2019   HCT 45.1 06/19/2019   MCV 83.4 06/19/2019   PLT 339 06/19/2019   Lab Results  Component Value Date   NA 136 06/19/2019   K 3.4 (L) 06/19/2019   CO2 17 (L) 06/19/2019   GLUCOSE 133 (H) 06/19/2019   BUN 17 06/19/2019   CREATININE 1.45 (H) 06/19/2019   BILITOT 0.8 06/19/2019   ALKPHOS 33 (L) 06/19/2019   AST 27 06/19/2019   ALT 33 06/19/2019   PROT 7.2 06/19/2019   ALBUMIN 4.3 06/19/2019   CALCIUM 9.2 06/19/2019   ANIONGAP 14 06/19/2019   Lab Results  Component Value Date   CHOL 196 05/12/2019   Lab Results  Component Value Date   HDL 31 (L) 05/12/2019   Lab Results  Component Value Date   LDLCALC 137 (H) 05/12/2019   Lab Results  Component  Value Date   TRIG 156 (H) 05/12/2019   Lab Results  Component Value Date   CHOLHDL 6.3 (H) 05/12/2019   No results found for: HGBA1C

## 2019-09-10 ENCOUNTER — Other Ambulatory Visit: Payer: Self-pay | Admitting: Family Medicine

## 2019-09-10 DIAGNOSIS — E782 Mixed hyperlipidemia: Secondary | ICD-10-CM

## 2019-09-10 LAB — CBC WITH DIFFERENTIAL/PLATELET
Basophils Absolute: 0 10*3/uL (ref 0.0–0.2)
Basos: 1 %
EOS (ABSOLUTE): 0.2 10*3/uL (ref 0.0–0.4)
Eos: 3 %
Hematocrit: 38.6 % (ref 34.0–46.6)
Hemoglobin: 13.1 g/dL (ref 11.1–15.9)
Immature Grans (Abs): 0 10*3/uL (ref 0.0–0.1)
Immature Granulocytes: 0 %
Lymphocytes Absolute: 1.6 10*3/uL (ref 0.7–3.1)
Lymphs: 26 %
MCH: 27 pg (ref 26.6–33.0)
MCHC: 33.9 g/dL (ref 31.5–35.7)
MCV: 80 fL (ref 79–97)
Monocytes Absolute: 0.3 10*3/uL (ref 0.1–0.9)
Monocytes: 5 %
Neutrophils Absolute: 4.1 10*3/uL (ref 1.4–7.0)
Neutrophils: 65 %
Platelets: 346 10*3/uL (ref 150–450)
RBC: 4.85 x10E6/uL (ref 3.77–5.28)
RDW: 13.1 % (ref 11.7–15.4)
WBC: 6.3 10*3/uL (ref 3.4–10.8)

## 2019-09-10 LAB — LIPID PANEL
Chol/HDL Ratio: 8.2 ratio — ABNORMAL HIGH (ref 0.0–4.4)
Cholesterol, Total: 206 mg/dL — ABNORMAL HIGH (ref 100–199)
HDL: 25 mg/dL — ABNORMAL LOW (ref >39)
LDL Chol Calc (NIH): 143 mg/dL — ABNORMAL HIGH (ref 0–99)
Triglycerides: 205 mg/dL — ABNORMAL HIGH (ref 0–149)
VLDL Cholesterol Cal: 38 mg/dL (ref 5–40)

## 2019-09-10 LAB — CMP14+EGFR
ALT: 27 IU/L (ref 0–32)
AST: 19 IU/L (ref 0–40)
Albumin/Globulin Ratio: 1.8 (ref 1.2–2.2)
Albumin: 4.2 g/dL (ref 3.8–4.8)
Alkaline Phosphatase: 40 IU/L — ABNORMAL LOW (ref 48–121)
BUN/Creatinine Ratio: 13 (ref 9–23)
BUN: 17 mg/dL (ref 6–24)
Bilirubin Total: 0.4 mg/dL (ref 0.0–1.2)
CO2: 20 mmol/L (ref 20–29)
Calcium: 9.3 mg/dL (ref 8.7–10.2)
Chloride: 104 mmol/L (ref 96–106)
Creatinine, Ser: 1.26 mg/dL — ABNORMAL HIGH (ref 0.57–1.00)
GFR calc Af Amer: 57 mL/min/{1.73_m2} — ABNORMAL LOW (ref >59)
GFR calc non Af Amer: 50 mL/min/{1.73_m2} — ABNORMAL LOW (ref >59)
Globulin, Total: 2.3 g/dL (ref 1.5–4.5)
Glucose: 114 mg/dL — ABNORMAL HIGH (ref 65–99)
Potassium: 3.9 mmol/L (ref 3.5–5.2)
Sodium: 138 mmol/L (ref 134–144)
Total Protein: 6.5 g/dL (ref 6.0–8.5)

## 2019-09-10 MED ORDER — ATORVASTATIN CALCIUM 10 MG PO TABS: 10.0000 mg | ORAL_TABLET | Freq: Every day | ORAL | 2 refills | Status: DC

## 2019-09-25 ENCOUNTER — Other Ambulatory Visit: Payer: Self-pay

## 2019-09-25 ENCOUNTER — Encounter: Payer: Self-pay | Admitting: Family Medicine

## 2019-09-25 ENCOUNTER — Ambulatory Visit: Payer: 59 | Admitting: Family Medicine

## 2019-09-25 VITALS — BP 109/71 | HR 79 | Temp 96.5°F | Ht 67.0 in | Wt 254.8 lb

## 2019-09-25 DIAGNOSIS — H00014 Hordeolum externum left upper eyelid: Secondary | ICD-10-CM | POA: Diagnosis not present

## 2019-09-25 NOTE — Progress Notes (Signed)
Assessment & Plan:  1. Hordeolum externum of left upper eyelid - Discussed cleansing with baby soap, applying warm compress QID, and massaging the eyelid. Patient is going to go over to [facility] since it is about to be a holiday weekend.    Follow up plan: Return if symptoms worsen or fail to improve.  Deliah Boston, MSN, APRN, FNP-C [facility]  Subjective:   Patient ID: Jamie Clay, female    DOB: 01/13/1971, 49 y.o.   MRN: [id number]  HPI: Jamie Clay is a 49 y.o. female presenting on 09/25/2019 for Eye Pain (Patient states for the last 2 days she has been having left eye pain and swelling.)  Patient c/o left eye swelling and erythema x2 days. No home treatments.    ROS: Negative unless specifically indicated above in HPI.   Relevant past medical history reviewed and updated as indicated.   Allergies and medications reviewed and updated.   Current Outpatient Medications:  .  amLODipine (NORVASC) 10 MG tablet, Take 1 tablet (10 mg total) by mouth daily., Disp: 90 tablet, Rfl: 3 .  atorvastatin (LIPITOR) 10 MG tablet, Take 1 tablet (10 mg total) by mouth daily., Disp: 30 tablet, Rfl: 2 .  esomeprazole (NEXIUM) 40 MG capsule, Take 1 capsule (40 mg total) by mouth daily at 12 noon., Disp: 90 capsule, Rfl: 3 .  fenofibrate 160 MG tablet, Take 1 tablet (160 mg total) by mouth daily., Disp: 90 tablet, Rfl: 3 .  hydrochlorothiazide (MICROZIDE) 12.5 MG capsule, Take 1 capsule (12.5 mg total) by mouth daily., Disp: 90 capsule, Rfl: 3 .  methocarbamol (ROBAXIN) 500 MG tablet, Take 1-2 tablets (500-1,000 mg total) by mouth every 8 (eight) hours as needed for muscle spasms., Disp: 30 tablet, Rfl: 1 .  montelukast (SINGULAIR) 10 MG tablet, Take 1 tablet (10 mg total) by mouth at bedtime., Disp: 90 tablet, Rfl: 3 .  fexofenadine (ALLEGRA) 30 MG tablet, Take 1 tablet by mouth daily., Disp: , Rfl:   Allergies  Allergen Reactions  . Vancomycin Hives  and Other (See Comments)    Objective:   BP 109/71   Pulse 79   Temp (!) 96.5 F (35.8 C) (Temporal)   Ht 5\' 7"  (1.702 m)   Wt 254 lb 12.8 oz (115.6 kg)   SpO2 98%   BMI 39.91 kg/m    Physical Exam Vitals reviewed.  Constitutional:      General: She is not in acute distress.    Appearance: Normal appearance. She is obese. She is not ill-appearing, toxic-appearing or diaphoretic.  HENT:     Head: Normocephalic and atraumatic.  Eyes:     General: No scleral icterus.       Right eye: No discharge.        Left eye: Hordeolum (left upper at tear duct) present.No foreign body or discharge.     Conjunctiva/sclera: Conjunctivae normal.  Cardiovascular:     Rate and Rhythm: Normal rate.  Pulmonary:     Effort: Pulmonary effort is normal. No respiratory distress.  Musculoskeletal:        General: Normal range of motion.     Cervical back: Normal range of motion.  Skin:    General: Skin is warm and dry.     Capillary Refill: Capillary refill takes less than 2 seconds.  Neurological:     General: No focal deficit present.     Mental Status: She is alert and oriented to person, place, and  time. Mental status is at baseline.  Psychiatric:        Mood and Affect: Mood normal.        Behavior: Behavior normal.        Thought Content: Thought content normal.        Judgment: Judgment normal.

## 2019-09-27 ENCOUNTER — Encounter: Payer: Self-pay | Admitting: Family Medicine

## 2019-10-27 ENCOUNTER — Telehealth: Payer: Self-pay | Admitting: Family Medicine

## 2019-10-27 ENCOUNTER — Ambulatory Visit (INDEPENDENT_AMBULATORY_CARE_PROVIDER_SITE_OTHER): Payer: 59 | Admitting: Family Medicine

## 2019-10-27 ENCOUNTER — Encounter: Payer: Self-pay | Admitting: Family Medicine

## 2019-10-27 DIAGNOSIS — J019 Acute sinusitis, unspecified: Secondary | ICD-10-CM | POA: Diagnosis not present

## 2019-10-27 MED ORDER — AMOXICILLIN-POT CLAVULANATE 875-125 MG PO TABS: 1.0000 | ORAL_TABLET | Freq: Two times a day (BID) | ORAL | 0 refills | Status: AC

## 2019-10-27 MED ORDER — PREDNISONE 10 MG (21) PO TBPK: ORAL_TABLET | ORAL | 0 refills | Status: DC

## 2019-10-27 MED ORDER — AMOXICILLIN-POT CLAVULANATE 875-125 MG PO TABS: 1.0000 | ORAL_TABLET | Freq: Two times a day (BID) | ORAL | 0 refills | Status: DC

## 2019-10-27 NOTE — Telephone Encounter (Signed)
Meds sent to [facility] pharmacy

## 2019-10-27 NOTE — Progress Notes (Signed)
Virtual Visit via Telephone Note  I connected with Jamie Clay on 10/27/19 at 2:32 PM by telephone and verified that I am speaking with the correct person using two identifiers. Jamie Clay is currently located at home and her daughters are currently with her during this visit. The provider, Gwenlyn Fudge, FNP is located in their office at time of visit.  I discussed the limitations, risks, security and privacy concerns of performing an evaluation and management service by telephone and the availability of in person appointments. I also discussed with the patient that there may be a patient responsible charge related to this service. The patient expressed understanding and agreed to proceed.  Subjective: PCP: Gwenlyn Fudge, FNP  Chief Complaint  Patient presents with  . URI   Patient complains of cough, head congestion, sneezing, sore throat, ear pain/pressure, facial pain/pressure and postnasal drainage. Onset of symptoms was 2 days ago, gradually worsening since that time. She is drinking plenty of fluids. Evaluation to date: none. Treatment to date: decongestants and Nyquil. She has a history of allergies. She does smoke.    ROS: Per HPI  Current Outpatient Medications:  .  amLODipine (NORVASC) 10 MG tablet, Take 1 tablet (10 mg total) by mouth daily., Disp: 90 tablet, Rfl: 3 .  atorvastatin (LIPITOR) 10 MG tablet, Take 1 tablet (10 mg total) by mouth daily., Disp: 30 tablet, Rfl: 2 .  esomeprazole (NEXIUM) 40 MG capsule, Take 1 capsule (40 mg total) by mouth daily at 12 noon., Disp: 90 capsule, Rfl: 3 .  fenofibrate 160 MG tablet, Take 1 tablet (160 mg total) by mouth daily., Disp: 90 tablet, Rfl: 3 .  fexofenadine (ALLEGRA) 30 MG tablet, Take 1 tablet by mouth daily., Disp: , Rfl:  .  hydrochlorothiazide (MICROZIDE) 12.5 MG capsule, Take 1 capsule (12.5 mg total) by mouth daily., Disp: 90 capsule, Rfl: 3 .  methocarbamol (ROBAXIN) 500 MG tablet, Take 1-2 tablets  (500-1,000 mg total) by mouth every 8 (eight) hours as needed for muscle spasms., Disp: 30 tablet, Rfl: 1 .  montelukast (SINGULAIR) 10 MG tablet, Take 1 tablet (10 mg total) by mouth at bedtime., Disp: 90 tablet, Rfl: 3  Allergies  Allergen Reactions  . Vancomycin Hives and Other (See Comments)   Past Medical History:  Diagnosis Date  . Arthritis   . Gallstones   . GERD (gastroesophageal reflux disease)   . HLD (hyperlipidemia)   . Hypertension   . Seasonal allergies     Observations/Objective: A&O  No respiratory distress or wheezing audible over the phone Mood, judgement, and thought processes all WNL  Assessment and Plan: 1. Acute non-recurrent sinusitis, unspecified location - Patient given information to schedule COVID-19 test. Continue symptom management.  - amoxicillin-clavulanate (AUGMENTIN) 875-125 MG tablet; Take 1 tablet by mouth 2 (two) times daily for 7 days.  Dispense: 14 tablet; Refill: 0 - predniSONE (STERAPRED UNI-PAK 21 TAB) 10 MG (21) TBPK tablet; As directed x 6 days  Dispense: 21 tablet; Refill: 0   Follow Up Instructions:  I discussed the assessment and treatment plan with the patient. The patient was provided an opportunity to ask questions and all were answered. The patient agreed with the plan and demonstrated an understanding of the instructions.   The patient was advised to call back or seek an in-person evaluation if the symptoms worsen or if the condition fails to improve as anticipated.  The above assessment and management plan was discussed with the patient. The patient  verbalized understanding of and has agreed to the management plan. Patient is aware to call the clinic if symptoms persist or worsen. Patient is aware when to return to the clinic for a follow-up visit. Patient educated on when it is appropriate to go to the emergency department.   Time call ended: 2:37 PM  I provided 7 minutes of non-face-to-face time during this  encounter.  Deliah Boston, MSN, APRN, FNP-C [facility] 10/27/19

## 2019-12-04 ENCOUNTER — Other Ambulatory Visit: Payer: Self-pay | Admitting: Family Medicine

## 2019-12-04 DIAGNOSIS — E782 Mixed hyperlipidemia: Secondary | ICD-10-CM

## 2019-12-29 ENCOUNTER — Other Ambulatory Visit: Payer: Self-pay | Admitting: Family Medicine

## 2019-12-29 DIAGNOSIS — E782 Mixed hyperlipidemia: Secondary | ICD-10-CM

## 2020-01-01 ENCOUNTER — Other Ambulatory Visit: Payer: Self-pay | Admitting: Family Medicine

## 2020-01-01 DIAGNOSIS — K219 Gastro-esophageal reflux disease without esophagitis: Secondary | ICD-10-CM

## 2020-01-01 DIAGNOSIS — J309 Allergic rhinitis, unspecified: Secondary | ICD-10-CM

## 2020-01-01 DIAGNOSIS — I10 Essential (primary) hypertension: Secondary | ICD-10-CM

## 2020-01-08 ENCOUNTER — Ambulatory Visit: Payer: 59 | Admitting: Family Medicine

## 2020-01-18 ENCOUNTER — Other Ambulatory Visit: Payer: Self-pay

## 2020-01-18 ENCOUNTER — Ambulatory Visit: Payer: 59 | Admitting: Nurse Practitioner

## 2020-01-18 ENCOUNTER — Encounter: Payer: Self-pay | Admitting: Nurse Practitioner

## 2020-01-18 VITALS — BP 112/78 | HR 86 | Temp 96.9°F | Ht 67.0 in | Wt 251.8 lb

## 2020-01-18 DIAGNOSIS — I10 Essential (primary) hypertension: Secondary | ICD-10-CM | POA: Diagnosis not present

## 2020-01-18 DIAGNOSIS — E785 Hyperlipidemia, unspecified: Secondary | ICD-10-CM | POA: Diagnosis not present

## 2020-01-18 NOTE — Patient Instructions (Signed)
Cholesterol Content in Foods Cholesterol is a waxy, fat-like substance that helps to carry fat in the blood. The body needs cholesterol in small amounts, but too much cholesterol can cause damage to the arteries and heart. Most people should eat less than 200 milligrams (mg) of cholesterol a day. Foods with cholesterol  Cholesterol is found in animal-based foods, such as meat, seafood, and dairy. Generally, low-fat dairy and lean meats have less cholesterol than full-fat dairy and fatty meats. The milligrams of cholesterol per serving (mg per serving) of common cholesterol-containing foods are listed below. Meat and other proteins  Egg -- one large whole egg has 186 mg.  Veal shank -- 4 oz has 141 mg.  Lean ground turkey (93% lean) -- 4 oz has 118 mg.  Fat-trimmed lamb loin -- 4 oz has 106 mg.  Lean ground beef (90% lean) -- 4 oz has 100 mg.  Lobster -- 3.5 oz has 90 mg.  Pork loin chops -- 4 oz has 86 mg.  Canned salmon -- 3.5 oz has 83 mg.  Fat-trimmed beef top loin -- 4 oz has 78 mg.  Frankfurter -- 1 frank (3.5 oz) has 77 mg.  Crab -- 3.5 oz has 71 mg.  Roasted chicken without skin, white meat -- 4 oz has 66 mg.  Light bologna -- 2 oz has 45 mg.  Deli-cut turkey -- 2 oz has 31 mg.  Canned tuna -- 3.5 oz has 31 mg.  Bacon -- 1 oz has 29 mg.  Oysters and mussels (raw) -- 3.5 oz has 25 mg.  Mackerel -- 1 oz has 22 mg.  Trout -- 1 oz has 20 mg.  Pork sausage -- 1 link (1 oz) has 17 mg.  Salmon -- 1 oz has 16 mg.  Tilapia -- 1 oz has 14 mg. Dairy  Soft-serve ice cream --  cup (4 oz) has 103 mg.  Whole-milk yogurt -- 1 cup (8 oz) has 29 mg.  Cheddar cheese -- 1 oz has 28 mg.  American cheese -- 1 oz has 28 mg.  Whole milk -- 1 cup (8 oz) has 23 mg.  2% milk -- 1 cup (8 oz) has 18 mg.  Cream cheese -- 1 tablespoon (Tbsp) has 15 mg.  Cottage cheese --  cup (4 oz) has 14 mg.  Low-fat (1%) milk -- 1 cup (8 oz) has 10 mg.  Sour cream -- 1 Tbsp has 8.5  mg.  Low-fat yogurt -- 1 cup (8 oz) has 8 mg.  Nonfat Greek yogurt -- 1 cup (8 oz) has 7 mg.  Half-and-half cream -- 1 Tbsp has 5 mg. Fats and oils  Cod liver oil -- 1 tablespoon (Tbsp) has 82 mg.  Butter -- 1 Tbsp has 15 mg.  Lard -- 1 Tbsp has 14 mg.  Bacon grease -- 1 Tbsp has 14 mg.  Mayonnaise -- 1 Tbsp has 5-10 mg.  Margarine -- 1 Tbsp has 3-10 mg. Exact amounts of cholesterol in these foods may vary depending on specific ingredients and brands. Foods without cholesterol Most plant-based foods do not have cholesterol unless you combine them with a food that has cholesterol. Foods without cholesterol include:  Grains and cereals.  Vegetables.  Fruits.  Vegetable oils, such as olive, canola, and sunflower oil.  Legumes, such as peas, beans, and lentils.  Nuts and seeds.  Egg whites. Summary  The body needs cholesterol in small amounts, but too much cholesterol can cause damage to the arteries and heart.    Most people should eat less than 200 milligrams (mg) of cholesterol a day. This information is not intended to replace advice given to you by your health care provider. Make sure you discuss any questions you have with your health care provider. Document Revised: 02/22/2017 Document Reviewed: 11/06/2016 Elsevier Patient Education  2020 Elsevier Inc. Hypertension, Adult Hypertension is another name for high blood pressure. High blood pressure forces your heart to work harder to pump blood. This can cause problems over time. There are two numbers in a blood pressure reading. There is a top number (systolic) over a bottom number (diastolic). It is best to have a blood pressure that is below 120/80. Healthy choices can help lower your blood pressure, or you may need medicine to help lower it. What are the causes? The cause of this condition is not known. Some conditions may be related to high blood pressure. What increases the risk?  Smoking.  Having type 2  diabetes mellitus, high cholesterol, or both.  Not getting enough exercise or physical activity.  Being overweight.  Having too much fat, sugar, calories, or salt (sodium) in your diet.  Drinking too much alcohol.  Having long-term (chronic) kidney disease.  Having a family history of high blood pressure.  Age. Risk increases with age.  Race. You may be at higher risk if you are African American.  Gender. Men are at higher risk than women before age 45. After age 65, women are at higher risk than men.  Having obstructive sleep apnea.  Stress. What are the signs or symptoms?  High blood pressure may not cause symptoms. Very high blood pressure (hypertensive crisis) may cause: ? Headache. ? Feelings of worry or nervousness (anxiety). ? Shortness of breath. ? Nosebleed. ? A feeling of being sick to your stomach (nausea). ? Throwing up (vomiting). ? Changes in how you see. ? Very bad chest pain. ? Seizures. How is this treated?  This condition is treated by making healthy lifestyle changes, such as: ? Eating healthy foods. ? Exercising more. ? Drinking less alcohol.  Your health care provider may prescribe medicine if lifestyle changes are not enough to get your blood pressure under control, and if: ? Your top number is above 130. ? Your bottom number is above 80.  Your personal target blood pressure may vary. Follow these instructions at home: Eating and drinking   If told, follow the DASH eating plan. To follow this plan: ? Fill one half of your plate at each meal with fruits and vegetables. ? Fill one fourth of your plate at each meal with whole grains. Whole grains include whole-wheat pasta, brown rice, and whole-grain bread. ? Eat or drink low-fat dairy products, such as skim milk or low-fat yogurt. ? Fill one fourth of your plate at each meal with low-fat (lean) proteins. Low-fat proteins include fish, chicken without skin, eggs, beans, and tofu. ? Avoid  fatty meat, cured and processed meat, or chicken with skin. ? Avoid pre-made or processed food.  Eat less than 1,500 mg of salt each day.  Do not drink alcohol if: ? Your doctor tells you not to drink. ? You are pregnant, may be pregnant, or are planning to become pregnant.  If you drink alcohol: ? Limit how much you use to:  0-1 drink a day for women.  0-2 drinks a day for men. ? Be aware of how much alcohol is in your drink. In the [place] one drink equals one 12 oz bottle of beer (  355 mL), one 5 oz glass of wine (148 mL), or one 1 oz glass of hard liquor (44 mL). Lifestyle   Work with your doctor to stay at a healthy weight or to lose weight. Ask your doctor what the best weight is for you.  Get at least 30 minutes of exercise most days of the week. This may include walking, swimming, or biking.  Get at least 30 minutes of exercise that strengthens your muscles (resistance exercise) at least 3 days a week. This may include lifting weights or doing Pilates.  Do not use any products that contain nicotine or tobacco, such as cigarettes, e-cigarettes, and chewing tobacco. If you need help quitting, ask your doctor.  Check your blood pressure at home as told by your doctor.  Keep all follow-up visits as told by your doctor. This is important. Medicines  Take over-the-counter and prescription medicines only as told by your doctor. Follow directions carefully.  Do not skip doses of blood pressure medicine. The medicine does not work as well if you skip doses. Skipping doses also puts you at risk for problems.  Ask your doctor about side effects or reactions to medicines that you should watch for. Contact a doctor if you:  Think you are having a reaction to the medicine you are taking.  Have headaches that keep coming back (recurring).  Feel dizzy.  Have swelling in your ankles.  Have trouble with your vision. Get help right away if you:  Get a very bad headache.  Start  to feel mixed up (confused).  Feel weak or numb.  Feel faint.  Have very bad pain in your: ? Chest. ? Belly (abdomen).  Throw up more than once.  Have trouble breathing. Summary  Hypertension is another name for high blood pressure.  High blood pressure forces your heart to work harder to pump blood.  For most people, a normal blood pressure is less than 120/80.  Making healthy choices can help lower blood pressure. If your blood pressure does not get lower with healthy choices, you may need to take medicine. This information is not intended to replace advice given to you by your health care provider. Make sure you discuss any questions you have with your health care provider. Document Revised: 11/20/2017 Document Reviewed: 11/20/2017 Elsevier Patient Education  2020 Elsevier Inc.  

## 2020-01-18 NOTE — Assessment & Plan Note (Signed)
Essential hypertension is well managed on current medication 10 mg amlodipine 1 tablet by mouth daily.  No changes to current medication.  Provided education to patient with printed handouts given.  Educated patient to continue healthy diet and exercise regimen. Follow-up in 3 to 4 months.

## 2020-01-18 NOTE — Assessment & Plan Note (Signed)
Patient hyperlipidemia is currently well managed on medication dose.  No changes to medication necessary.  Provided education to patient with printed handouts given on reducing cholesterol in diet and exercise as tolerated. Labs completed-lipid panel, CMP. Follow-up in 3 to 4 months.

## 2020-01-18 NOTE — Progress Notes (Signed)
Established Patient Office Visit  Subjective:  Patient ID: Jamie Clay, female    DOB: 1970-10-06  Age: 49 y.o. MRN: [id number]  CC:  Chief Complaint  Patient presents with  . Hypertension    check up of chronic medical conditions  . Hyperlipidemia    HPI Jamie Clay presents for Pt presents for follow up of hypertension. Patient was diagnosed in 05/06/2009. The patient is tolerating the medication well without side effects. Compliance with treatment has been good; including taking  as directed, current medication amlodipine 10 mg tablet by mouth daily.  Patient maintains a healthy diet and regular exercise regimen , and following up as directed.  Mixed hyperlipidemia  Pt presents with hyperlipidemia. Patient was diagnosed in 08/03/2009.Marland Kitchen Compliance with treatment has been good; The patient is compliant with medications, maintains a low cholesterol diet , follows up as directed , and maintains an exercise regimen . The patient denies experiencing any hypercholesterolemia related symptoms.  Current medication is atorvastatin 10 mg tablet by mouth daily.   Past Medical History:  Diagnosis Date  . Arthritis   . Gallstones   . GERD (gastroesophageal reflux disease)   . HLD (hyperlipidemia)   . Hypertension   . Seasonal allergies     Past Surgical History:  Procedure Laterality Date  . ABDOMINAL HYSTERECTOMY    . CARPOMETACARPEL SUSPENSION PLASTY Right 04/27/2014   Procedure: RIGHT THUMB CARPOMETACARPAL ARTHROPLASTY  [name] ;  Surgeon: Betha Loa, MD;  [facility]:  [facility];  Service: Orthopedics;  Laterality: Right;  . CESAREAN SECTION  02,95  . CHOLECYSTECTOMY  1997  . COLON SURGERY  08/01/2015   x6, one with appendectomy  . LUMBAR DISC SURGERY  2009  . LUMBAR FUSION  2011  . PARTIAL HYMENECTOMY  2003  . TONSILLECTOMY    . TUBAL LIGATION      Family History  Problem Relation Age of Onset  . Heart attack Father 83       X2  . Diabetes Father   .  Heart disease Father   . Colon cancer Mother 80  . Asthma Sister   . Diabetes Sister   . Heart disease Sister   . Heart attack Paternal Grandfather   . Lung cancer Maternal Grandfather   . Other Paternal Grandmother        stomach swelling      Outpatient Medications Prior to Visit  Medication Sig Dispense Refill  . amLODipine (NORVASC) 10 MG tablet TAKE 1 TABLET BY MOUTH EVERY DAY 90 tablet 0  . atorvastatin (LIPITOR) 10 MG tablet TAKE 1 TABLET BY MOUTH EVERY DAY 90 tablet 0  . esomeprazole (NEXIUM) 40 MG capsule TAKE 1 CAPSULE (40 MG TOTAL) BY MOUTH DAILY AT 12 NOON. 90 capsule 0  . fenofibrate 160 MG tablet TAKE 1 TABLET BY MOUTH EVERY DAY 90 tablet 0  . hydrochlorothiazide (MICROZIDE) 12.5 MG capsule TAKE 1 CAPSULE BY MOUTH EVERY DAY 90 capsule 0  . methocarbamol (ROBAXIN) 500 MG tablet Take 1-2 tablets (500-1,000 mg total) by mouth every 8 (eight) hours as needed for muscle spasms. 30 tablet 1  . montelukast (SINGULAIR) 10 MG tablet TAKE 1 TABLET BY MOUTH EVERYDAY AT BEDTIME 90 tablet 0  . fexofenadine (ALLEGRA) 30 MG tablet Take 1 tablet by mouth daily.    . predniSONE (STERAPRED UNI-PAK 21 TAB) 10 MG (21) TBPK tablet As directed x 6 days 21 tablet 0   No facility-administered medications prior to visit.    Allergies  Allergen Reactions  . Vancomycin Hives and Other (See Comments)    ROS Review of Systems  Skin: Negative for color change and rash.  Neurological: Negative for light-headedness, numbness and headaches.  All other systems reviewed and are negative.     Objective:    Physical Exam Vitals reviewed.  Constitutional:      Appearance: Normal appearance.  HENT:     Head: Normocephalic.  Eyes:     Conjunctiva/sclera: Conjunctivae normal.  Cardiovascular:     Rate and Rhythm: Normal rate and regular rhythm.     Pulses: Normal pulses.     Heart sounds: Normal heart sounds.  Pulmonary:     Effort: Pulmonary effort is normal.     Breath sounds: Normal  breath sounds.  Abdominal:     General: Bowel sounds are normal.  Musculoskeletal:        General: No tenderness. Normal range of motion.  Skin:    General: Skin is warm.     Findings: No erythema or rash.  Neurological:     Mental Status: She is alert and oriented to person, place, and time.  Psychiatric:        Mood and Affect: Mood normal.        Behavior: Behavior normal.     BP 112/78   Pulse 86   Temp (!) 96.9 F (36.1 C) (Temporal)   Ht 5\' 7"  (1.702 m)   Wt 251 lb 12.8 oz (114.2 kg)   SpO2 97%   BMI 39.44 kg/m  Wt Readings from Last 3 Encounters:  09/25/19 254 lb 12.8 oz (115.6 kg)  09/09/19 253 lb 9.6 oz (115 kg)  06/19/19 250 lb (113.4 kg)     Health Maintenance Due  Topic Date Due  . COVID-19 Vaccine (1) Never done  . HIV Screening  Never done  . COLONOSCOPY  Never done  . INFLUENZA VACCINE  10/25/2019   No results found for: TSH Lab Results  Component Value Date   WBC 6.3 09/09/2019   HGB 13.1 09/09/2019   HCT 38.6 09/09/2019   MCV 80 09/09/2019   PLT 346 09/09/2019   Lab Results  Component Value Date   NA 138 09/09/2019   K 3.9 09/09/2019   CO2 20 09/09/2019   GLUCOSE 114 (H) 09/09/2019   BUN 17 09/09/2019   CREATININE 1.26 (H) 09/09/2019   BILITOT 0.4 09/09/2019   ALKPHOS 40 (L) 09/09/2019   AST 19 09/09/2019   ALT 27 09/09/2019   PROT 6.5 09/09/2019   ALBUMIN 4.2 09/09/2019   CALCIUM 9.3 09/09/2019   ANIONGAP 14 06/19/2019   Lab Results  Component Value Date   CHOL 206 (H) 09/09/2019   Lab Results  Component Value Date   HDL 25 (L) 09/09/2019   Lab Results  Component Value Date   LDLCALC 143 (H) 09/09/2019   Lab Results  Component Value Date   TRIG 205 (H) 09/09/2019   Lab Results  Component Value Date   CHOLHDL 8.2 (H) 09/09/2019    Assessment & Plan:   Problem List Items Addressed This Visit      Cardiovascular and Mediastinum   Essential hypertension - Primary    Essential hypertension is well managed on  current medication 10 mg amlodipine 1 tablet by mouth daily.  No changes to current medication.  Provided education to patient with printed handouts given.  Educated patient to continue healthy diet and exercise regimen. Follow-up in 3 to 4 months.  Relevant Orders   Comprehensive metabolic panel     Other   Hyperlipidemia    Patient hyperlipidemia is currently well managed on medication dose.  No changes to medication necessary.  Provided education to patient with printed handouts given on reducing cholesterol in diet and exercise as tolerated. Labs completed-lipid panel, CMP. Follow-up in 3 to 4 months.      Relevant Orders   Lipid Panel        Follow-up: Return in about 3 months (around 04/19/2020).    Daryll Drown, NP

## 2020-01-19 LAB — COMPREHENSIVE METABOLIC PANEL
ALT: 29 IU/L (ref 0–32)
AST: 19 IU/L (ref 0–40)
Albumin/Globulin Ratio: 2.1 (ref 1.2–2.2)
Albumin: 4.4 g/dL (ref 3.8–4.8)
Alkaline Phosphatase: 39 IU/L — ABNORMAL LOW (ref 44–121)
BUN/Creatinine Ratio: 13 (ref 9–23)
BUN: 15 mg/dL (ref 6–24)
Bilirubin Total: 0.4 mg/dL (ref 0.0–1.2)
CO2: 19 mmol/L — ABNORMAL LOW (ref 20–29)
Calcium: 9.2 mg/dL (ref 8.7–10.2)
Chloride: 105 mmol/L (ref 96–106)
Creatinine, Ser: 1.15 mg/dL — ABNORMAL HIGH (ref 0.57–1.00)
GFR calc Af Amer: 64 mL/min/{1.73_m2} (ref >59)
GFR calc non Af Amer: 56 mL/min/{1.73_m2} — ABNORMAL LOW (ref >59)
Globulin, Total: 2.1 g/dL (ref 1.5–4.5)
Glucose: 111 mg/dL — ABNORMAL HIGH (ref 65–99)
Potassium: 4 mmol/L (ref 3.5–5.2)
Sodium: 138 mmol/L (ref 134–144)
Total Protein: 6.5 g/dL (ref 6.0–8.5)

## 2020-01-19 LAB — LIPID PANEL
Chol/HDL Ratio: 5.7 ratio — ABNORMAL HIGH (ref 0.0–4.4)
Cholesterol, Total: 159 mg/dL (ref 100–199)
HDL: 28 mg/dL — ABNORMAL LOW (ref >39)
LDL Chol Calc (NIH): 107 mg/dL — ABNORMAL HIGH (ref 0–99)
Triglycerides: 131 mg/dL (ref 0–149)
VLDL Cholesterol Cal: 24 mg/dL (ref 5–40)

## 2020-02-28 ENCOUNTER — Other Ambulatory Visit: Payer: Self-pay | Admitting: Family Medicine

## 2020-02-28 DIAGNOSIS — E782 Mixed hyperlipidemia: Secondary | ICD-10-CM

## 2020-03-30 ENCOUNTER — Other Ambulatory Visit: Payer: Self-pay | Admitting: Family Medicine

## 2020-03-30 DIAGNOSIS — J309 Allergic rhinitis, unspecified: Secondary | ICD-10-CM

## 2020-03-30 DIAGNOSIS — K219 Gastro-esophageal reflux disease without esophagitis: Secondary | ICD-10-CM

## 2020-03-30 DIAGNOSIS — I10 Essential (primary) hypertension: Secondary | ICD-10-CM

## 2020-03-30 DIAGNOSIS — E782 Mixed hyperlipidemia: Secondary | ICD-10-CM

## 2020-04-11 ENCOUNTER — Other Ambulatory Visit: Payer: Self-pay | Admitting: Family Medicine

## 2020-04-11 DIAGNOSIS — J309 Allergic rhinitis, unspecified: Secondary | ICD-10-CM

## 2020-04-15 ENCOUNTER — Other Ambulatory Visit: Payer: Self-pay | Admitting: Family Medicine

## 2020-04-15 DIAGNOSIS — E782 Mixed hyperlipidemia: Secondary | ICD-10-CM

## 2020-04-15 MED ORDER — ATORVASTATIN CALCIUM 10 MG PO TABS: 10.0000 mg | ORAL_TABLET | Freq: Every day | ORAL | 0 refills | Status: DC

## 2020-04-18 ENCOUNTER — Telehealth: Payer: Self-pay

## 2020-04-18 NOTE — Telephone Encounter (Signed)
I spoke to the pt and she states she became symptomatic 04/04/20 and only has a slight cough but no fever or any other symptoms. Advised pt she is ok to come into the office.

## 2020-04-19 ENCOUNTER — Ambulatory Visit (INDEPENDENT_AMBULATORY_CARE_PROVIDER_SITE_OTHER): Payer: 59 | Admitting: Family Medicine

## 2020-04-19 ENCOUNTER — Other Ambulatory Visit: Payer: Self-pay

## 2020-04-19 ENCOUNTER — Encounter: Payer: Self-pay | Admitting: Family Medicine

## 2020-04-19 VITALS — BP 114/87 | HR 93 | Temp 96.8°F | Ht 67.0 in | Wt 257.6 lb

## 2020-04-19 DIAGNOSIS — K219 Gastro-esophageal reflux disease without esophagitis: Secondary | ICD-10-CM

## 2020-04-19 DIAGNOSIS — I10 Essential (primary) hypertension: Secondary | ICD-10-CM | POA: Diagnosis not present

## 2020-04-19 DIAGNOSIS — N1831 Chronic kidney disease, stage 3a: Secondary | ICD-10-CM

## 2020-04-19 DIAGNOSIS — E782 Mixed hyperlipidemia: Secondary | ICD-10-CM

## 2020-04-19 DIAGNOSIS — U071 COVID-19: Secondary | ICD-10-CM

## 2020-04-19 DIAGNOSIS — J309 Allergic rhinitis, unspecified: Secondary | ICD-10-CM

## 2020-04-19 MED ORDER — PREDNISONE 10 MG (21) PO TBPK: ORAL_TABLET | ORAL | 0 refills | Status: DC

## 2020-04-19 NOTE — Progress Notes (Signed)
Assessment & Plan:  1. Essential hypertension - Well controlled on current regimen.   2. Stage 3a chronic kidney disease (HCC) - Improving every time I do lab work.  Avoid NSAIDs.  3. Mixed hyperlipidemia - Well controlled on current regimen.   4. Gastroesophageal reflux disease without esophagitis - Well controlled on current regimen.   5. Chronic allergic rhinitis - Well controlled on current regimen.   6. COVID-19 - Improving. - predniSONE (STERAPRED UNI-PAK 21 TAB) 10 MG (21) TBPK tablet; As directed x 6 days  Dispense: 21 tablet; Refill: 0   Return in about 6 months (around 10/17/2020) for annual physical.  Deliah Boston, MSN, APRN, FNP-C [facility]  Subjective:    Patient ID: Jamie Clay, female    DOB: [birth year], 50 y.o.   MRN: [id number]  Patient Care Team: Gwenlyn Fudge, FNP as PCP - General (Family Medicine)   Chief Complaint:  Chief Complaint  Patient presents with  . Hypertension    3 month follow up of chronic medical conditions     HPI: Jamie Clay is a 50 y.o. female presenting on 04/19/2020 for Hypertension (3 month follow up of chronic medical conditions/)  Hyperlipidemia: Patient was started on atorvastatin seven months ago which she is tolerating.  Her cholesterol levels improved with her lab work three months ago.  CKD: GFR improved on her lab work she had done three months ago and even further on the lab work she had completed earlier this month when she went to the ER at [facility].   Patient reports she did get this shingles vaccines at [facility].  New complaints: Patient was diagnosed with COVID-19 on the 13th of this month and reports she is still having a cough and head congestion with pressure in her face ears.   Social history:  Relevant past medical, surgical, family and social history reviewed and updated as indicated. Interim medical history since our last visit reviewed.  Allergies and medications  reviewed and updated.  DATA REVIEWED: CHART IN EPIC  ROS: Negative unless specifically indicated above in HPI.    Current Outpatient Medications:  .  amLODipine (NORVASC) 10 MG tablet, TAKE 1 TABLET BY MOUTH EVERY DAY, Disp: 90 tablet, Rfl: 0 .  atorvastatin (LIPITOR) 10 MG tablet, Take 1 tablet (10 mg total) by mouth daily., Disp: 90 tablet, Rfl: 0 .  esomeprazole (NEXIUM) 40 MG capsule, TAKE 1 CAPSULE (40 MG TOTAL) BY MOUTH DAILY AT 12 NOON., Disp: 90 capsule, Rfl: 0 .  fenofibrate 160 MG tablet, TAKE 1 TABLET BY MOUTH EVERY DAY, Disp: 90 tablet, Rfl: 0 .  hydrochlorothiazide (MICROZIDE) 12.5 MG capsule, TAKE 1 CAPSULE BY MOUTH EVERY DAY, Disp: 90 capsule, Rfl: 0 .  montelukast (SINGULAIR) 10 MG tablet, TAKE 1 TABLET BY MOUTH EVERYDAY AT BEDTIME, Disp: 90 tablet, Rfl: 2 .  fexofenadine (ALLEGRA) 30 MG tablet, Take 1 tablet by mouth daily., Disp: , Rfl:    Allergies  Allergen Reactions  . Vancomycin Hives and Other (See Comments)   Past Medical History:  Diagnosis Date  . Arthritis   . Gallstones   . GERD (gastroesophageal reflux disease)   . HLD (hyperlipidemia)   . Hypertension   . Seasonal allergies     Past Surgical History:  Procedure Laterality Date  . ABDOMINAL HYSTERECTOMY    . CARPOMETACARPEL SUSPENSION PLASTY Right 04/27/2014   Procedure: RIGHT THUMB CARPOMETACARPAL ARTHROPLASTY  [name] ;  Surgeon: Betha Loa, MD;  Location: [facility];  Service: Orthopedics;  Laterality: Right;  . CESAREAN SECTION  02,95  . CHOLECYSTECTOMY  1997  . COLON SURGERY  08/01/2015   x6, one with appendectomy  . LUMBAR DISC SURGERY  2009  . LUMBAR FUSION  2011  . PARTIAL HYMENECTOMY  2003  . TONSILLECTOMY    . TUBAL LIGATION      Social History   Socioeconomic History  . Marital status: Married    Spouse name: Not on file  . Number of children: 2  . Years of education: Not on file  . Highest education level: Not on file  Occupational History  . 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 Smoking status: Current Every Day Smoker    Packs/day: 0.25    Years: 25.00    Pack years: 6.25    Types: Cigarettes  . Smokeless tobacco: Never Used  Substance and Sexual Activity  . Alcohol use: No    Alcohol/week: 0.0 standard drinks  . Drug use: No  . Sexual activity: Yes    Birth control/protection: Surgical  Other Topics Concern  . Not on file  Social History Narrative  . Not on file   Social Determinants of Health   Financial Resource Strain: Not on file  Food Insecurity: Not on file  Transportation Needs: Not on file  Physical Activity: Not on file  Stress: Not on file  Social Connections: Not on file  Intimate Partner Violence: Not on file        Objective:    BP 114/87   Pulse 93   Temp (!) 96.8 F (36 C) (Temporal)   Ht 5\' 7"  (1.702 m)   Wt 257 lb 9.6 oz (116.8 kg)   SpO2 98%   BMI 40.35 kg/m   Wt Readings from Last 3 Encounters:  04/19/20 257 lb 9.6 oz (116.8 kg)  01/18/20 251 lb 12.8 oz (114.2 kg)  09/25/19 254 lb 12.8 oz (115.6 kg)    Physical Exam Vitals reviewed.  Constitutional:      General: She is not in acute distress.    Appearance: Normal appearance. She is obese. She is not ill-appearing, toxic-appearing or diaphoretic.  HENT:     Head: Normocephalic and atraumatic.  Eyes:     General: No scleral icterus.       Right eye: No discharge.        Left eye: No discharge.     Conjunctiva/sclera: Conjunctivae normal.  Cardiovascular:     Rate and Rhythm: Normal rate and regular rhythm.     Heart sounds: Normal heart sounds. No murmur heard. No friction rub. No gallop.   Pulmonary:     Effort: Pulmonary effort is normal. No respiratory distress.     Breath sounds: Normal breath sounds. No stridor. No wheezing, rhonchi or rales.  Musculoskeletal:        General: Normal range of motion.     Cervical back: Normal range of motion.  Skin:    General: Skin is warm and dry.     Capillary Refill:  Capillary refill takes less than 2 seconds.  Neurological:     General: No focal deficit present.     Mental Status: She is alert and oriented to person, place, and time. Mental status is at baseline.  Psychiatric:        Mood and Affect: Mood normal.        Behavior: Behavior normal.        Thought Content: Thought content normal.  Judgment: Judgment normal.     No results found for: TSH Lab Results  Component Value Date   WBC 6.3 09/09/2019   HGB 13.1 09/09/2019   HCT 38.6 09/09/2019   MCV 80 09/09/2019   PLT 346 09/09/2019   Lab Results  Component Value Date   NA 138 01/18/2020   K 4.0 01/18/2020   CO2 19 (L) 01/18/2020   GLUCOSE 111 (H) 01/18/2020   BUN 15 01/18/2020   CREATININE 1.15 (H) 01/18/2020   BILITOT 0.4 01/18/2020   ALKPHOS 39 (L) 01/18/2020   AST 19 01/18/2020   ALT 29 01/18/2020   PROT 6.5 01/18/2020   ALBUMIN 4.4 01/18/2020   CALCIUM 9.2 01/18/2020   ANIONGAP 14 06/19/2019   Lab Results  Component Value Date   CHOL 159 01/18/2020   Lab Results  Component Value Date   HDL 28 (L) 01/18/2020   Lab Results  Component Value Date   LDLCALC 107 (H) 01/18/2020   Lab Results  Component Value Date   TRIG 131 01/18/2020   Lab Results  Component Value Date   CHOLHDL 5.7 (H) 01/18/2020   No results found for: HGBA1C

## 2020-06-22 ENCOUNTER — Other Ambulatory Visit: Payer: Self-pay | Admitting: Family Medicine

## 2020-06-22 DIAGNOSIS — E782 Mixed hyperlipidemia: Secondary | ICD-10-CM

## 2020-06-22 DIAGNOSIS — K219 Gastro-esophageal reflux disease without esophagitis: Secondary | ICD-10-CM

## 2020-06-22 DIAGNOSIS — I10 Essential (primary) hypertension: Secondary | ICD-10-CM

## 2020-08-15 ENCOUNTER — Encounter: Payer: Self-pay | Admitting: Family Medicine

## 2020-09-15 ENCOUNTER — Ambulatory Visit: Payer: 59 | Admitting: Family Medicine

## 2020-09-15 ENCOUNTER — Encounter: Payer: Self-pay | Admitting: Family Medicine

## 2020-09-15 DIAGNOSIS — J069 Acute upper respiratory infection, unspecified: Secondary | ICD-10-CM | POA: Diagnosis not present

## 2020-09-15 DIAGNOSIS — R059 Cough, unspecified: Secondary | ICD-10-CM

## 2020-09-15 LAB — VERITOR FLU A/B WAIVED
Influenza A: NEGATIVE
Influenza B: NEGATIVE

## 2020-09-15 NOTE — Addendum Note (Signed)
Addended by: Margorie John on: 09/15/2020 01:12 PM   Modules accepted: Orders

## 2020-09-15 NOTE — Progress Notes (Signed)
Virtual Visit via Telephone Note  I connected with Jamie Clay on 09/15/20 at 12:18 PM by telephone and verified that I am speaking with the correct person using two identifiers. Jamie Clay is currently located at home and her daughter is currently with her during this visit. The provider, Gwenlyn Fudge, FNP is located in their office at time of visit.  I discussed the limitations, risks, security and privacy concerns of performing an evaluation and management service by telephone and the availability of in person appointments. I also discussed with the patient that there may be a patient responsible charge related to this service. The patient expressed understanding and agreed to proceed.  Subjective: PCP: Gwenlyn Fudge, FNP  Chief Complaint  Patient presents with   URI   Patient complains of cough, sneezing, sore throat, and facial pain/pressure. Additional symptoms include head congestion, headache, runny nose, ear pain/pressure, and postnasal drainage. Onset of symptoms was 2 days ago, unchanged since that time. She is drinking plenty of fluids. Evaluation to date: none. Treatment to date:  Dayquil and Nyquil . She does not smoke.    ROS: Per HPI  Current Outpatient Medications:    amLODipine (NORVASC) 10 MG tablet, TAKE 1 TABLET BY MOUTH EVERY DAY, Disp: 90 tablet, Rfl: 0   atorvastatin (LIPITOR) 10 MG tablet, Take 1 tablet (10 mg total) by mouth daily., Disp: 90 tablet, Rfl: 0   esomeprazole (NEXIUM) 40 MG capsule, TAKE 1 CAPSULE (40 MG TOTAL) BY MOUTH DAILY AT 12 NOON., Disp: 90 capsule, Rfl: 0   fenofibrate 160 MG tablet, TAKE 1 TABLET BY MOUTH EVERY DAY, Disp: 90 tablet, Rfl: 0   fexofenadine (ALLEGRA) 30 MG tablet, Take 1 tablet by mouth daily., Disp: , Rfl:    hydrochlorothiazide (MICROZIDE) 12.5 MG capsule, TAKE 1 CAPSULE BY MOUTH EVERY DAY, Disp: 90 capsule, Rfl: 0   montelukast (SINGULAIR) 10 MG tablet, TAKE 1 TABLET BY MOUTH EVERYDAY AT BEDTIME, Disp: 90  tablet, Rfl: 2   predniSONE (STERAPRED UNI-PAK 21 TAB) 10 MG (21) TBPK tablet, As directed x 6 days, Disp: 21 tablet, Rfl: 0  Allergies  Allergen Reactions   Vancomycin Hives and Other (See Comments)   Past Medical History:  Diagnosis Date   Arthritis    Gallstones    GERD (gastroesophageal reflux disease)    HLD (hyperlipidemia)    Hypertension    Seasonal allergies     Observations/Objective: A&O  No respiratory distress or wheezing audible over the phone Mood, judgement, and thought processes all WNL  Assessment and Plan: 1. Viral URI Discussed symptom management.  2. Cough - Novel Coronavirus, NAA ([facility]); Future - Veritor Flu A/B Waived; Future   Follow Up Instructions:  I discussed the assessment and treatment plan with the patient. The patient was provided an opportunity to ask questions and all were answered. The patient agreed with the plan and demonstrated an understanding of the instructions.   The patient was advised to call back or seek an in-person evaluation if the symptoms worsen or if the condition fails to improve as anticipated.  The above assessment and management plan was discussed with the patient. The patient verbalized understanding of and has agreed to the management plan. Patient is aware to call the clinic if symptoms persist or worsen. Patient is aware when to return to the clinic for a follow-up visit. Patient educated on when it is appropriate to go to the emergency department.   Time call ended: 12:29 PM  I provided 11 minutes of non-face-to-face time during this encounter.  Deliah Boston, MSN, APRN, FNP-C [facility] 09/15/20

## 2020-09-16 LAB — NOVEL CORONAVIRUS, NAA: SARS-CoV-2, NAA: DETECTED — AB

## 2020-09-16 LAB — SARS-COV-2, NAA 2 DAY TAT

## 2020-09-18 ENCOUNTER — Other Ambulatory Visit: Payer: Self-pay | Admitting: Family Medicine

## 2020-09-18 DIAGNOSIS — E782 Mixed hyperlipidemia: Secondary | ICD-10-CM

## 2020-09-18 DIAGNOSIS — I10 Essential (primary) hypertension: Secondary | ICD-10-CM

## 2020-09-18 DIAGNOSIS — K219 Gastro-esophageal reflux disease without esophagitis: Secondary | ICD-10-CM

## 2020-10-05 ENCOUNTER — Other Ambulatory Visit: Payer: Self-pay | Admitting: Family Medicine

## 2020-10-05 DIAGNOSIS — M62830 Muscle spasm of back: Secondary | ICD-10-CM

## 2020-10-15 ENCOUNTER — Other Ambulatory Visit: Payer: Self-pay | Admitting: Family Medicine

## 2020-10-15 DIAGNOSIS — E782 Mixed hyperlipidemia: Secondary | ICD-10-CM

## 2020-10-18 ENCOUNTER — Ambulatory Visit (INDEPENDENT_AMBULATORY_CARE_PROVIDER_SITE_OTHER): Payer: 59 | Admitting: Family Medicine

## 2020-10-18 ENCOUNTER — Other Ambulatory Visit: Payer: Self-pay

## 2020-10-18 ENCOUNTER — Encounter: Payer: Self-pay | Admitting: Family Medicine

## 2020-10-18 VITALS — BP 135/83 | HR 83 | Temp 97.2°F | Resp 20 | Ht 67.0 in | Wt 253.0 lb

## 2020-10-18 DIAGNOSIS — U099 Post covid-19 condition, unspecified: Secondary | ICD-10-CM

## 2020-10-18 DIAGNOSIS — I10 Essential (primary) hypertension: Secondary | ICD-10-CM | POA: Diagnosis not present

## 2020-10-18 DIAGNOSIS — Z0001 Encounter for general adult medical examination with abnormal findings: Secondary | ICD-10-CM

## 2020-10-18 DIAGNOSIS — J309 Allergic rhinitis, unspecified: Secondary | ICD-10-CM

## 2020-10-18 DIAGNOSIS — E782 Mixed hyperlipidemia: Secondary | ICD-10-CM | POA: Diagnosis not present

## 2020-10-18 DIAGNOSIS — Z114 Encounter for screening for human immunodeficiency virus [HIV]: Secondary | ICD-10-CM

## 2020-10-18 DIAGNOSIS — Z720 Tobacco use: Secondary | ICD-10-CM | POA: Diagnosis not present

## 2020-10-18 DIAGNOSIS — R053 Chronic cough: Secondary | ICD-10-CM

## 2020-10-18 DIAGNOSIS — K219 Gastro-esophageal reflux disease without esophagitis: Secondary | ICD-10-CM

## 2020-10-18 DIAGNOSIS — Z0000 Encounter for general adult medical examination without abnormal findings: Secondary | ICD-10-CM

## 2020-10-18 DIAGNOSIS — Z1159 Encounter for screening for other viral diseases: Secondary | ICD-10-CM

## 2020-10-18 MED ORDER — ATORVASTATIN CALCIUM 10 MG PO TABS
10.0000 mg | ORAL_TABLET | Freq: Every day | ORAL | 1 refills | Status: DC
Start: 2020-10-18 — End: 2021-04-20

## 2020-10-18 MED ORDER — FENOFIBRATE 160 MG PO TABS: 160.0000 mg | ORAL_TABLET | Freq: Every day | ORAL | 1 refills | Status: DC

## 2020-10-18 MED ORDER — AMLODIPINE BESYLATE 10 MG PO TABS: 10.0000 mg | ORAL_TABLET | Freq: Every day | ORAL | 1 refills | Status: DC

## 2020-10-18 MED ORDER — BENZONATATE 100 MG PO CAPS: 100.0000 mg | ORAL_CAPSULE | Freq: Three times a day (TID) | ORAL | 2 refills | Status: DC | PRN

## 2020-10-18 MED ORDER — MONTELUKAST SODIUM 10 MG PO TABS: 10.0000 mg | ORAL_TABLET | Freq: Every day | ORAL | 1 refills | Status: DC

## 2020-10-18 MED ORDER — ESOMEPRAZOLE MAGNESIUM 40 MG PO CPDR
40.0000 mg | DELAYED_RELEASE_CAPSULE | Freq: Every day | ORAL | 3 refills | Status: AC
Start: 2020-10-18 — End: ?

## 2020-10-18 MED ORDER — HYDROCHLOROTHIAZIDE 12.5 MG PO CAPS: 12.5000 mg | ORAL_CAPSULE | Freq: Every day | ORAL | 1 refills | Status: DC

## 2020-10-18 NOTE — Progress Notes (Signed)
Assessment & Plan:  1. Well adult exam Preventive health education provided.  Patient declined COVID vaccines. - CBC with Differential/Platelet - CMP14+EGFR - Lipid panel - HM COLONOSCOPY  2. Tobacco use Encouraged smoking cessation.  She does not meet criteria for the low-dose lung cancer screening CT. - CBC with Differential/Platelet - CMP14+EGFR - Lipid panel  3. Essential hypertension Well controlled on current regimen.  - amLODipine (NORVASC) 10 MG tablet; Take 1 tablet (10 mg total) by mouth daily.  Dispense: 90 tablet; Refill: 1 - hydrochlorothiazide (MICROZIDE) 12.5 MG capsule; Take 1 capsule (12.5 mg total) by mouth daily.  Dispense: 90 capsule; Refill: 1 - CBC with Differential/Platelet - CMP14+EGFR - Lipid panel  4. Mixed hyperlipidemia Well controlled on current regimen.  - atorvastatin (LIPITOR) 10 MG tablet; Take 1 tablet (10 mg total) by mouth daily.  Dispense: 90 tablet; Refill: 1 - fenofibrate 160 MG tablet; Take 1 tablet (160 mg total) by mouth daily.  Dispense: 90 tablet; Refill: 1 - CMP14+EGFR - Lipid panel  5. Gastroesophageal reflux disease without esophagitis Well controlled on current regimen.  - esomeprazole (NEXIUM) 40 MG capsule; Take 1 capsule (40 mg total) by mouth daily.  Dispense: 90 capsule; Refill: 3 - CMP14+EGFR  6. Chronic allergic rhinitis Well controlled on current regimen.  - montelukast (SINGULAIR) 10 MG tablet; Take 1 tablet (10 mg total) by mouth at bedtime.  Dispense: 90 tablet; Refill: 1  7. Post-COVID chronic cough Started [employer] as needed. - benzonatate (TESSALON PERLES) 100 MG capsule; Take 1 capsule (100 mg total) by mouth 3 (three) times daily as needed for cough.  Dispense: 30 capsule; Refill: 2  8. Encounter for hepatitis C screening test for low risk patient - Hepatitis C antibody (reflex, frozen specimen)  9. Encounter for screening for HIV - HIV antibody (with reflex)   Follow-up: Return in about 6  months (around 04/20/2021) for follow-up of chronic medication conditions.   Hendricks Limes, MSN, APRN, FNP-C [facility]  Subjective:  Patient ID: Jamie Clay, female    DOB: 03/10/1971  Age: 50 y.o. MRN: [id number]  Patient Care Team: Loman Brooklyn, FNP as PCP - General (Family Medicine)   CC:  Chief Complaint  Patient presents with   Annual Exam    HPI Jamie Clay presents for her annual physical.  Occupation: Naval architect, Marital status: Married, Substance use: None Last colonoscopy: 01/17/2016 Last mammogram: 08/14/2018 Last pap smear: Total abdominal hysterectomy Lung Cancer Screening with low-dose Chest CT: Does not meet criteria for pack years Hepatitis C Screening: Agreeable to do today Immunizations: Flu Vaccine:  Not flu season Tdap Vaccine: up to date  Shingrix Vaccine: up to date  COVID-19 Vaccine: declined  DEPRESSION SCREENING PHQ 2/9 Scores 10/18/2020 04/19/2020 01/18/2020 09/25/2019 09/09/2019 05/12/2019 01/07/2019  PHQ - 2 Score 0 0 0 0 0 0 0  PHQ- 9 Score 2 - - - - - -     Review of Systems  Constitutional:  Negative for chills, fever, malaise/fatigue and weight loss.  HENT:  Negative for congestion, ear discharge, ear pain, nosebleeds, sinus pain, sore throat and tinnitus.   Eyes:  Negative for blurred vision, double vision, pain, discharge and redness.  Respiratory:  Positive for cough (Dry aggravating cough since having COVID in January and June. 6-7 Hall's cough drops/day.). Negative for shortness of breath and wheezing.   Cardiovascular:  Negative for chest pain, palpitations and leg swelling.  Gastrointestinal:  Negative for abdominal pain,  constipation, diarrhea, heartburn, nausea and vomiting.  Genitourinary:  Negative for dysuria, frequency and urgency.  Musculoskeletal:  Negative for myalgias.  Skin:  Negative for rash.  Neurological:  Negative for dizziness, seizures, weakness and headaches.   Psychiatric/Behavioral:  Negative for depression, substance abuse and suicidal ideas. The patient is not nervous/anxious.     Current Outpatient Medications:    amLODipine (NORVASC) 10 MG tablet, TAKE 1 TABLET BY MOUTH EVERY DAY, Disp: 90 tablet, Rfl: 0   atorvastatin (LIPITOR) 10 MG tablet, TAKE 1 TABLET BY MOUTH EVERY DAY, Disp: 90 tablet, Rfl: 0   esomeprazole (NEXIUM) 40 MG capsule, TAKE 1 CAPSULE (40 MG TOTAL) BY MOUTH DAILY AT 12 NOON., Disp: 90 capsule, Rfl: 0   fenofibrate 160 MG tablet, TAKE 1 TABLET BY MOUTH EVERY DAY, Disp: 90 tablet, Rfl: 0   fexofenadine (ALLEGRA) 30 MG tablet, Take 1 tablet by mouth daily., Disp: , Rfl:    hydrochlorothiazide (MICROZIDE) 12.5 MG capsule, TAKE 1 CAPSULE BY MOUTH EVERY DAY, Disp: 90 capsule, Rfl: 0   montelukast (SINGULAIR) 10 MG tablet, TAKE 1 TABLET BY MOUTH EVERYDAY AT BEDTIME, Disp: 90 tablet, Rfl: 2  Allergies  Allergen Reactions   Vancomycin Hives and Other (See Comments)    Past Medical History:  Diagnosis Date   Arthritis    Gallstones    GERD (gastroesophageal reflux disease)    HLD (hyperlipidemia)    Hypertension    Seasonal allergies     Past Surgical History:  Procedure Laterality Date   CARPOMETACARPEL SUSPENSION PLASTY Right 04/27/2014   Procedure: RIGHT THUMB CARPOMETACARPAL ARTHROPLASTY  [name] ;  Surgeon: Leanora Cover, MD;  Location: [place] [facility];  Service: Orthopedics;  Laterality: Right;   CESAREAN SECTION  02,95   CHOLECYSTECTOMY  1997   COLON SURGERY  08/01/2015   x6, one with appendectomy   [place] SURGERY  2009   LUMBAR FUSION  2011   PARTIAL HYMENECTOMY  2003   TONSILLECTOMY     TOTAL ABDOMINAL HYSTERECTOMY     TUBAL LIGATION      Family History  Problem Relation Age of Onset   Heart attack Father 24       X2   Diabetes Father    Heart disease Father    Colon cancer Mother 76   Asthma Sister    Diabetes Sister    Heart disease Sister    Heart attack Paternal Grandfather     Lung cancer Maternal Grandfather    Other Paternal Grandmother        stomach swelling    Social History   Socioeconomic History   Marital status: Married    Spouse name: Not on file   Number of children: 2   Years of education: Not on file   Highest education level: Not on file  Occupational History   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  Tobacco Use   Smoking status: Every Day    Packs/day: 0.25    Years: 25.00    Pack years: 6.25    Types: Cigarettes   Smokeless tobacco: Never  Substance and Sexual Activity   Alcohol use: No    Alcohol/week: 0.0 standard drinks   Drug use: No   Sexual activity: Yes    Birth control/protection: Surgical  Other Topics Concern   Not on file  Social History Narrative   Not on file   Social Determinants of Health   Financial Resource Strain: Not on file  Food Insecurity: Not on file  Transportation  Needs: Not on file  Physical Activity: Not on file  Stress: Not on file  Social Connections: Not on file  Intimate Partner Violence: Not on file      Objective:    BP 135/83   Pulse 83   Temp (!) 97.2 F (36.2 C) (Temporal)   Resp 20   Ht 5' 7"  (1.702 m)   Wt 253 lb (114.8 kg)   SpO2 97%   BMI 39.63 kg/m   Wt Readings from Last 3 Encounters:  10/18/20 253 lb (114.8 kg)  04/19/20 257 lb 9.6 oz (116.8 kg)  01/18/20 251 lb 12.8 oz (114.2 kg)    Physical Exam Vitals reviewed.  Constitutional:      General: She is not in acute distress.    Appearance: Normal appearance. She is obese. She is not ill-appearing, toxic-appearing or diaphoretic.  HENT:     Head: Normocephalic and atraumatic.     Right Ear: Tympanic membrane, ear canal and external ear normal. There is no impacted cerumen.     Left Ear: Tympanic membrane, ear canal and external ear normal. There is no impacted cerumen.     Nose: Nose normal. No congestion or rhinorrhea.     Mouth/Throat:     Mouth: Mucous membranes are moist.     Pharynx: Oropharynx is clear. No  oropharyngeal exudate or posterior oropharyngeal erythema.  Eyes:     General: No scleral icterus.       Right eye: No discharge.        Left eye: No discharge.     Conjunctiva/sclera: Conjunctivae normal.     Pupils: Pupils are equal, round, and reactive to light.  Cardiovascular:     Rate and Rhythm: Normal rate and regular rhythm.     Heart sounds: Normal heart sounds. No murmur heard.   No friction rub. No gallop.  Pulmonary:     Effort: Pulmonary effort is normal. No respiratory distress.     Breath sounds: Normal breath sounds. No stridor. No wheezing, rhonchi or rales.  Abdominal:     General: Abdomen is flat. Bowel sounds are normal. There is no distension.     Palpations: Abdomen is soft. There is no mass.     Tenderness: There is no abdominal tenderness. There is no guarding or rebound.     Hernia: No hernia is present.  Musculoskeletal:        General: Normal range of motion.     Cervical back: Normal range of motion and neck supple. No rigidity. No muscular tenderness.  Lymphadenopathy:     Cervical: No cervical adenopathy.  Skin:    General: Skin is warm and dry.     Capillary Refill: Capillary refill takes less than 2 seconds.  Neurological:     General: No focal deficit present.     Mental Status: She is alert and oriented to person, place, and time. Mental status is at baseline.  Psychiatric:        Mood and Affect: Mood normal.        Behavior: Behavior normal.        Thought Content: Thought content normal.        Judgment: Judgment normal.    No results found for: TSH Lab Results  Component Value Date   WBC 6.3 09/09/2019   HGB 13.1 09/09/2019   HCT 38.6 09/09/2019   MCV 80 09/09/2019   PLT 346 09/09/2019   Lab Results  Component Value Date   NA 138 01/18/2020  K 4.0 01/18/2020   CO2 19 (L) 01/18/2020   GLUCOSE 111 (H) 01/18/2020   BUN 15 01/18/2020   CREATININE 1.15 (H) 01/18/2020   BILITOT 0.4 01/18/2020   ALKPHOS 39 (L) 01/18/2020   AST  19 01/18/2020   ALT 29 01/18/2020   PROT 6.5 01/18/2020   ALBUMIN 4.4 01/18/2020   CALCIUM 9.2 01/18/2020   ANIONGAP 14 06/19/2019   Lab Results  Component Value Date   CHOL 159 01/18/2020   Lab Results  Component Value Date   HDL 28 (L) 01/18/2020   Lab Results  Component Value Date   LDLCALC 107 (H) 01/18/2020   Lab Results  Component Value Date   TRIG 131 01/18/2020   Lab Results  Component Value Date   CHOLHDL 5.7 (H) 01/18/2020   No results found for: HGBA1C

## 2020-10-18 NOTE — Patient Instructions (Signed)
Preventive Care 51-51 Years Old, Female Preventive care refers to lifestyle choices and visits with your health care provider that can promote health and wellness. This includes: A yearly physical exam. This is also called an annual wellness visit. Regular dental and eye exams. Immunizations. Screening for certain conditions. Healthy lifestyle choices, such as: Eating a healthy diet. Getting regular exercise. Not using drugs or products that contain nicotine and tobacco. Limiting alcohol use. What can I expect for my preventive care visit? Physical exam Your health care provider will check your: Height and weight. These may be used to calculate your BMI (body mass index). BMI is a measurement that tells if you are at a healthy weight. Heart rate and blood pressure. Body temperature. Skin for abnormal spots. Counseling Your health care provider may ask you questions about your: Past medical problems. Family's medical history. Alcohol, tobacco, and drug use. Emotional well-being. Home life and relationship well-being. Sexual activity. Diet, exercise, and sleep habits. Work and work Statistician. Access to firearms. Method of birth control. Menstrual cycle. Pregnancy history. What immunizations do I need?  Vaccines are usually given at various ages, according to a schedule. Your health care provider will recommend vaccines for you based on your age, medicalhistory, and lifestyle or other factors, such as travel or where you work. What tests do I need? Blood tests Lipid and cholesterol levels. These may be checked every 5 years, or more often if you are over 37 years old. Hepatitis C test. Hepatitis B test. Screening Lung cancer screening. You may have this screening every year starting at age 30 if you have a 30-pack-year history of smoking and currently smoke or have quit within the past 15 years. Colorectal cancer screening. All adults should have this screening starting at  age 23 and continuing until age 3. Your health care provider may recommend screening at age 51 if you are at increased risk. You will have tests every 1-10 years, depending on your results and the type of screening test. Diabetes screening. This is done by checking your blood sugar (glucose) after you have not eaten for a while (fasting). You may have this done every 1-3 years. Mammogram. This may be done every 1-2 years. Talk with your health care provider about when you should start having regular mammograms. This may depend on whether you have a family history of breast cancer. BRCA-related cancer screening. This may be done if you have a family history of breast, ovarian, tubal, or peritoneal cancers. Pelvic exam and Pap test. This may be done every 3 years starting at age 51. Starting at age 54, this may be done every 5 years if you have a Pap test in combination with an HPV test. Other tests STD (sexually transmitted disease) testing, if you are at risk. Bone density scan. This is done to screen for osteoporosis. You may have this scan if you are at high risk for osteoporosis. Talk with your health care provider about your test results, treatment options,and if necessary, the need for more tests. Follow these instructions at home: Eating and drinking  Eat a diet that includes fresh fruits and vegetables, whole grains, lean protein, and low-fat dairy products. Take vitamin and mineral supplements as recommended by your health care provider. Do not drink alcohol if: Your health care provider tells you not to drink. You are pregnant, may be pregnant, or are planning to become pregnant. If you drink alcohol: Limit how much you have to 0-1 drink a day. Be aware  of how much alcohol is in your drink. In the [place] one drink equals one 12 oz bottle of beer (355 mL), one 5 oz glass of wine (148 mL), or one 1 oz glass of hard liquor (44 mL).  Lifestyle Take daily care of your teeth and  gums. Brush your teeth every morning and night with fluoride toothpaste. Floss one time each day. Stay active. Exercise for at least 30 minutes 5 or more days each week. Do not use any products that contain nicotine or tobacco, such as cigarettes, e-cigarettes, and chewing tobacco. If you need help quitting, ask your health care provider. Do not use drugs. If you are sexually active, practice safe sex. Use a condom or other form of protection to prevent STIs (sexually transmitted infections). If you do not wish to become pregnant, use a form of birth control. If you plan to become pregnant, see your health care provider for a prepregnancy visit. If told by your health care provider, take low-dose aspirin daily starting at age 29. Find healthy ways to cope with stress, such as: Meditation, yoga, or listening to music. Journaling. Talking to a trusted person. Spending time with friends and family. Safety Always wear your seat belt while driving or riding in a vehicle. Do not drive: If you have been drinking alcohol. Do not ride with someone who has been drinking. When you are tired or distracted. While texting. Wear a helmet and other protective equipment during sports activities. If you have firearms in your house, make sure you follow all gun safety procedures. What's next? Visit your health care provider once a year for an annual wellness visit. Ask your health care provider how often you should have your eyes and teeth checked. Stay up to date on all vaccines. This information is not intended to replace advice given to you by your health care provider. Make sure you discuss any questions you have with your healthcare provider. Document Revised: 12/15/2019 Document Reviewed: 11/21/2017 Elsevier Patient Education  2022 [employer].

## 2020-10-19 LAB — CMP14+EGFR
ALT: 24 IU/L (ref 0–32)
AST: 18 IU/L (ref 0–40)
Albumin/Globulin Ratio: 2 (ref 1.2–2.2)
Albumin: 4.4 g/dL (ref 3.8–4.9)
Alkaline Phosphatase: 40 IU/L — ABNORMAL LOW (ref 44–121)
BUN/Creatinine Ratio: 16 (ref 9–23)
BUN: 18 mg/dL (ref 6–24)
Bilirubin Total: 0.5 mg/dL (ref 0.0–1.2)
CO2: 19 mmol/L — ABNORMAL LOW (ref 20–29)
Calcium: 9.4 mg/dL (ref 8.7–10.2)
Chloride: 102 mmol/L (ref 96–106)
Creatinine, Ser: 1.1 mg/dL — ABNORMAL HIGH (ref 0.57–1.00)
Globulin, Total: 2.2 g/dL (ref 1.5–4.5)
Glucose: 108 mg/dL — ABNORMAL HIGH (ref 65–99)
Potassium: 4.1 mmol/L (ref 3.5–5.2)
Sodium: 137 mmol/L (ref 134–144)
Total Protein: 6.6 g/dL (ref 6.0–8.5)
eGFR: 61 mL/min/{1.73_m2} (ref >59)

## 2020-10-19 LAB — LIPID PANEL
Chol/HDL Ratio: 5.2 ratio — ABNORMAL HIGH (ref 0.0–4.4)
Cholesterol, Total: 165 mg/dL (ref 100–199)
HDL: 32 mg/dL — ABNORMAL LOW (ref >39)
LDL Chol Calc (NIH): 106 mg/dL — ABNORMAL HIGH (ref 0–99)
Triglycerides: 152 mg/dL — ABNORMAL HIGH (ref 0–149)
VLDL Cholesterol Cal: 27 mg/dL (ref 5–40)

## 2020-10-19 LAB — CBC WITH DIFFERENTIAL/PLATELET
Basophils Absolute: 0.1 10*3/uL (ref 0.0–0.2)
Basos: 1 %
EOS (ABSOLUTE): 0.3 10*3/uL (ref 0.0–0.4)
Eos: 3 %
Hematocrit: 38.1 % (ref 34.0–46.6)
Hemoglobin: 12.2 g/dL (ref 11.1–15.9)
Immature Grans (Abs): 0 10*3/uL (ref 0.0–0.1)
Immature Granulocytes: 0 %
Lymphocytes Absolute: 1.8 10*3/uL (ref 0.7–3.1)
Lymphs: 25 %
MCH: 25.3 pg — ABNORMAL LOW (ref 26.6–33.0)
MCHC: 32 g/dL (ref 31.5–35.7)
MCV: 79 fL (ref 79–97)
Monocytes Absolute: 0.4 10*3/uL (ref 0.1–0.9)
Monocytes: 5 %
Neutrophils Absolute: 4.8 10*3/uL (ref 1.4–7.0)
Neutrophils: 66 %
Platelets: 375 10*3/uL (ref 150–450)
RBC: 4.82 x10E6/uL (ref 3.77–5.28)
RDW: 14.9 % (ref 11.7–15.4)
WBC: 7.4 10*3/uL (ref 3.4–10.8)

## 2020-10-19 LAB — HIV ANTIBODY (ROUTINE TESTING W REFLEX): HIV Screen 4th Generation wRfx: NONREACTIVE

## 2020-10-19 LAB — HCV INTERPRETATION

## 2020-10-19 LAB — HCV AB W REFLEX TO QUANT PCR: HCV Ab: <0.1 s/co ratio (ref 0.0–0.9)

## 2020-12-07 ENCOUNTER — Encounter: Payer: Self-pay | Admitting: Family Medicine

## 2020-12-07 ENCOUNTER — Other Ambulatory Visit: Payer: Self-pay

## 2020-12-07 ENCOUNTER — Ambulatory Visit: Payer: 59 | Admitting: Family Medicine

## 2020-12-07 VITALS — BP 128/84 | HR 94 | Temp 97.2°F | Ht 67.0 in | Wt 257.0 lb

## 2020-12-07 DIAGNOSIS — M5442 Lumbago with sciatica, left side: Secondary | ICD-10-CM | POA: Diagnosis not present

## 2020-12-07 DIAGNOSIS — G8929 Other chronic pain: Secondary | ICD-10-CM

## 2020-12-07 MED ORDER — PREDNISONE 10 MG (21) PO TBPK: ORAL_TABLET | ORAL | 0 refills | Status: DC

## 2020-12-07 NOTE — Progress Notes (Signed)
Assessment & Plan:  1. Chronic left-sided low back pain with left-sided sciatica - oral steroids to help with inflammation and pain - education provided on sciatica - predniSONE (STERAPRED UNI-PAK 21 TAB) 10 MG (21) TBPK tablet; As directed x 6 days  Dispense: 21 tablet; Refill: 0 - ambulatory referral to [facility] - ambulatory to physical therapy  Follow up plan: Return if symptoms worsen or fail to improve.  Floy Sabina, NP Student  I personally was present during the history, physical exam, and medical decision-making activities of this service and have verified that the service and findings are accurately documented in the nurse practitioner student's note.  Deliah Boston, MSN, APRN, FNP-C [facility]   Subjective:   Patient ID: Jamie Clay, female    DOB: [birth year], 50 y.o.   MRN: [id number]  HPI: Jamie Clay is a 51 y.o. female presenting on 12/07/2020 for Back Pain (Patient states that she has been having left sided lower back pain that goes down to left knee x 4 days)   She states she has recurrent left lower back pain with pain radiating to the hip and knee. She states the pain is worse with standing for long periods. She has been treated in the past with prednisone and it improved her pain for several months. In December last year she was seen at the ER because she was suddenly unable to walk. She was given steroids and then improved until now. She has been taking motrin for the pain which has not helped. She has also tried heat and ice therapy. She has had lumbar disc surgery in 2009 and a lumbar fusion in 2011 with Dr. Yetta Barre with [facility].    ROS: Negative unless specifically indicated above in HPI.   Relevant past medical history reviewed and updated as indicated.   Allergies and medications reviewed and updated.   Current Outpatient Medications:    amLODipine (NORVASC) 10 MG tablet, Take 1  tablet (10 mg total) by mouth daily., Disp: 90 tablet, Rfl: 1   atorvastatin (LIPITOR) 10 MG tablet, Take 1 tablet (10 mg total) by mouth daily., Disp: 90 tablet, Rfl: 1   esomeprazole (NEXIUM) 40 MG capsule, Take 1 capsule (40 mg total) by mouth daily., Disp: 90 capsule, Rfl: 3   fenofibrate 160 MG tablet, Take 1 tablet (160 mg total) by mouth daily., Disp: 90 tablet, Rfl: 1   hydrochlorothiazide (MICROZIDE) 12.5 MG capsule, Take 1 capsule (12.5 mg total) by mouth daily., Disp: 90 capsule, Rfl: 1   montelukast (SINGULAIR) 10 MG tablet, Take 1 tablet (10 mg total) by mouth at bedtime., Disp: 90 tablet, Rfl: 1   predniSONE (STERAPRED UNI-PAK 21 TAB) 10 MG (21) TBPK tablet, As directed x 6 days, Disp: 21 tablet, Rfl: 0   fexofenadine (ALLEGRA) 30 MG tablet, Take 1 tablet by mouth daily., Disp: , Rfl:   Allergies  Allergen Reactions   Vancomycin Hives and Other (See Comments)    Objective:   BP 128/84   Pulse 94   Temp (!) 97.2 F (36.2 C) (Temporal)   Ht 5\' 7"  (1.702 m)   Wt 116.6 kg   BMI 40.25 kg/m    Physical Exam Vitals reviewed.  Constitutional:      General: She is not in acute distress.    Appearance: Normal appearance. She is morbidly obese. She is not ill-appearing, toxic-appearing or diaphoretic.  HENT:     Head: Normocephalic and atraumatic.  Mouth/Throat:     Mouth: Mucous membranes are moist.     Pharynx: Oropharynx is clear.  Eyes:     Extraocular Movements: Extraocular movements intact.     Pupils: Pupils are equal, round, and reactive to light.  Cardiovascular:     Rate and Rhythm: Normal rate and regular rhythm.  Pulmonary:     Effort: Pulmonary effort is normal.  Abdominal:     Palpations: Abdomen is soft.  Musculoskeletal:        General: Tenderness (left hip and lower back) present. No swelling.     Cervical back: Normal range of motion.  Skin:    General: Skin is warm and dry.  Neurological:     General: No focal deficit present.     Mental  Status: She is alert and oriented to person, place, and time.     Motor: No weakness.     Gait: Gait normal.  Psychiatric:        Mood and Affect: Mood normal.        Behavior: Behavior normal.        Thought Content: Thought content normal.        Judgment: Judgment normal.

## 2020-12-30 ENCOUNTER — Ambulatory Visit (INDEPENDENT_AMBULATORY_CARE_PROVIDER_SITE_OTHER): Payer: 59 | Admitting: Family Medicine

## 2020-12-30 ENCOUNTER — Other Ambulatory Visit: Payer: Self-pay

## 2020-12-30 ENCOUNTER — Ambulatory Visit (INDEPENDENT_AMBULATORY_CARE_PROVIDER_SITE_OTHER): Payer: 59

## 2020-12-30 ENCOUNTER — Encounter: Payer: Self-pay | Admitting: Family Medicine

## 2020-12-30 VITALS — BP 142/87 | HR 100 | Temp 98.8°F | Ht 67.0 in | Wt 257.4 lb

## 2020-12-30 DIAGNOSIS — R1084 Generalized abdominal pain: Secondary | ICD-10-CM | POA: Diagnosis not present

## 2020-12-30 LAB — URINALYSIS, ROUTINE W REFLEX MICROSCOPIC
Bilirubin, UA: NEGATIVE
Glucose, UA: NEGATIVE
Ketones, UA: NEGATIVE
Leukocytes,UA: NEGATIVE
Nitrite, UA: NEGATIVE
Protein,UA: NEGATIVE
RBC, UA: NEGATIVE
Specific Gravity, UA: >1.03 — ABNORMAL HIGH (ref 1.005–1.030)
Urobilinogen, Ur: 0.2 mg/dL (ref 0.2–1.0)
pH, UA: 5.5 (ref 5.0–7.5)

## 2020-12-30 NOTE — Patient Instructions (Signed)

## 2020-12-30 NOTE — Progress Notes (Signed)
Acute Office Visit  Subjective:    Patient ID: Jamie Clay, female    DOB: [birth year], 50 y.o.   MRN: [id number]  Chief Complaint  Patient presents with   Abdominal Pain    HPI Patient is in today for abdominal pain that started this morning about 7 hours ago. The pain has been getting gradually worse. It is upper to mid abdominal pain. The pain is intermittent. It is crampy and sharp. The pain is a 3-7/10. She is worse with movement. She has been nauseous but denies vomiting. Reports no appetite today. Denies fever, chills, diarrhea, or urinary symptoms. She has tried gas X and tums without improvement. Her last BM was about 2 hours ago it was a small BM. She had to take a laxative on Wednesday for constipaton. She has a significant GI history. She has had her gallbladder and appendix removed. She has a history of GERD and diverticulitis. She has had a large bowel perforation and  small bowel obstruction in the past. She has had park of her large bowel removed and had an ostomy at one point. The ostomy has since been reversed.   Past Medical History:  Diagnosis Date   Arthritis    Gallstones    GERD (gastroesophageal reflux disease)    HLD (hyperlipidemia)    Hypertension    Seasonal allergies     Past Surgical History:  Procedure Laterality Date   CARPOMETACARPEL SUSPENSION PLASTY Right 04/27/2014   Procedure: RIGHT THUMB CARPOMETACARPAL ARTHROPLASTY  [name] ;  Surgeon: Leanora Cover, MD;  Location: [facility];  Service: Orthopedics;  Laterality: Right;   CESAREAN SECTION  02,95   CHOLECYSTECTOMY  1997   COLON SURGERY  08/01/2015   x6, one with appendectomy   [facility]  2009   LUMBAR FUSION  2011   PARTIAL HYMENECTOMY  2003   TONSILLECTOMY     TOTAL ABDOMINAL HYSTERECTOMY     TUBAL LIGATION      Family History  Problem Relation Age of Onset   Heart attack Father 62       X2   Diabetes Father    Heart disease Father    Colon cancer Mother  20   Asthma Sister    Diabetes Sister    Heart disease Sister    Heart attack Paternal Grandfather    Lung cancer Maternal Grandfather    Other Paternal Grandmother        stomach swelling    Social History   Socioeconomic History   Marital status: Married    Spouse name: Not on file   Number of children: 2   Years of education: Not on file   Highest education level: Not on file  Occupational History   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  Tobacco Use   Smoking status: Every Day    Packs/day: 0.25    Years: 25.00    Pack years: 6.25    Types: Cigarettes   Smokeless tobacco: Never  Substance and Sexual Activity   Alcohol use: No    Alcohol/week: 0.0 standard drinks   Drug use: No   Sexual activity: Yes    Birth control/protection: Surgical  Other Topics Concern   Not on file  Social History Narrative   Not on file   Social Determinants of Health   Financial Resource Strain: Not on file  Food Insecurity: Not on file  Transportation Needs: Not on file  Physical Activity: Not on file  Stress: Not on  file  Social Connections: Not on file  Intimate Partner Violence: Not on file    Outpatient Medications Prior to Visit  Medication Sig Dispense Refill   amLODipine (NORVASC) 10 MG tablet Take 1 tablet (10 mg total) by mouth daily. 90 tablet 1   atorvastatin (LIPITOR) 10 MG tablet Take 1 tablet (10 mg total) by mouth daily. 90 tablet 1   esomeprazole (NEXIUM) 40 MG capsule Take 1 capsule (40 mg total) by mouth daily. 90 capsule 3   fenofibrate 160 MG tablet Take 1 tablet (160 mg total) by mouth daily. 90 tablet 1   fexofenadine (ALLEGRA) 30 MG tablet Take 1 tablet by mouth daily.     hydrochlorothiazide (MICROZIDE) 12.5 MG capsule Take 1 capsule (12.5 mg total) by mouth daily. 90 capsule 1   montelukast (SINGULAIR) 10 MG tablet Take 1 tablet (10 mg total) by mouth at bedtime. 90 tablet 1   predniSONE (STERAPRED UNI-PAK 21 TAB) 10 MG (21) TBPK tablet As directed x 6 days 21  tablet 0   No facility-administered medications prior to visit.    Allergies  Allergen Reactions   Vancomycin Hives and Other (See Comments)    Review of Systems As per HPI.     Objective:    Physical Exam Vitals and nursing note reviewed.  Constitutional:      General: She is not in acute distress.    Appearance: She is not ill-appearing, toxic-appearing or diaphoretic.  Abdominal:     General: Bowel sounds are decreased. There is no distension.     Palpations: Abdomen is soft.     Tenderness: There is no right CVA tenderness, left CVA tenderness, guarding or rebound.     Hernia: No hernia is present.       Comments: Area of tenderness as above, otherwise no tenderness. Significant scar tissue present down midline of abdomen.   Neurological:     Mental Status: She is alert.    BP (!) 142/87   Pulse 100   Temp 98.8 F (37.1 C) (Temporal)   Ht 5' 7"  (1.702 m)   Wt 257 lb 6 oz (116.7 kg)   BMI 40.31 kg/m  Wt Readings from Last 3 Encounters:  12/30/20 257 lb 6 oz (116.7 kg)  12/07/20 257 lb (116.6 kg)  10/18/20 253 lb (114.8 kg)    Health Maintenance Due  Topic Date Due   COVID-19 Vaccine (1) Never done   MAMMOGRAM  08/13/2020   INFLUENZA VACCINE  10/24/2020    There are no preventive care reminders to display for this patient.   No results found for: TSH Lab Results  Component Value Date   WBC 7.4 10/18/2020   HGB 12.2 10/18/2020   HCT 38.1 10/18/2020   MCV 79 10/18/2020   PLT 375 10/18/2020   Lab Results  Component Value Date   NA 137 10/18/2020   K 4.1 10/18/2020   CO2 19 (L) 10/18/2020   GLUCOSE 108 (H) 10/18/2020   BUN 18 10/18/2020   CREATININE 1.10 (H) 10/18/2020   BILITOT 0.5 10/18/2020   ALKPHOS 40 (L) 10/18/2020   AST 18 10/18/2020   ALT 24 10/18/2020   PROT 6.6 10/18/2020   ALBUMIN 4.4 10/18/2020   CALCIUM 9.4 10/18/2020   ANIONGAP 14 06/19/2019   EGFR 61 10/18/2020   Lab Results  Component Value Date   CHOL 165 10/18/2020    Lab Results  Component Value Date   HDL 32 (L) 10/18/2020   Lab Results  Component  Value Date   LDLCALC 106 (H) 10/18/2020   Lab Results  Component Value Date   TRIG 152 (H) 10/18/2020   Lab Results  Component Value Date   CHOLHDL 5.2 (H) 10/18/2020   No results found for: HGBA1C     Assessment & Plan:   Jamie Clay was seen today for abdominal pain.  Diagnoses and all orders for this visit:  Generalized abdominal pain KUB today - non obstructive gas pattern and possible kidney stones. Negative UA today. Discussed constipation as cause of pain and that with her extensive scare tissue since is likely to have increased pain. Recommended miralax BID for next few days. Discussed possibility of diverticulitis, however patient declined treatment for this today. Strict return precautions given, discussed when to seek emergency care.  -     DG Abd 1 View; Future -     Urinalysis, Routine w reflex microscopic  Return to office for new or worsening symptoms, or if symptoms persist.   The patient indicates understanding of these issues and agrees with the plan.  Gwenlyn Perking, FNP

## 2021-04-20 ENCOUNTER — Encounter: Payer: Self-pay | Admitting: Family Medicine

## 2021-04-20 ENCOUNTER — Ambulatory Visit (INDEPENDENT_AMBULATORY_CARE_PROVIDER_SITE_OTHER): Payer: 59 | Admitting: Family Medicine

## 2021-04-20 ENCOUNTER — Ambulatory Visit (INDEPENDENT_AMBULATORY_CARE_PROVIDER_SITE_OTHER): Payer: 59

## 2021-04-20 VITALS — BP 132/89 | HR 99 | Temp 98.1°F | Ht 67.0 in | Wt 258.4 lb

## 2021-04-20 DIAGNOSIS — I10 Essential (primary) hypertension: Secondary | ICD-10-CM

## 2021-04-20 DIAGNOSIS — J309 Allergic rhinitis, unspecified: Secondary | ICD-10-CM | POA: Diagnosis not present

## 2021-04-20 DIAGNOSIS — M25512 Pain in left shoulder: Secondary | ICD-10-CM | POA: Diagnosis not present

## 2021-04-20 DIAGNOSIS — K219 Gastro-esophageal reflux disease without esophagitis: Secondary | ICD-10-CM

## 2021-04-20 DIAGNOSIS — N1831 Chronic kidney disease, stage 3a: Secondary | ICD-10-CM | POA: Diagnosis not present

## 2021-04-20 DIAGNOSIS — E782 Mixed hyperlipidemia: Secondary | ICD-10-CM

## 2021-04-20 MED ORDER — HYDROCHLOROTHIAZIDE 12.5 MG PO CAPS: 12.5000 mg | ORAL_CAPSULE | Freq: Every day | ORAL | 1 refills | Status: DC

## 2021-04-20 MED ORDER — MONTELUKAST SODIUM 10 MG PO TABS: 10.0000 mg | ORAL_TABLET | Freq: Every day | ORAL | 1 refills | Status: DC

## 2021-04-20 MED ORDER — DICLOFENAC SODIUM 75 MG PO TBEC: 75.0000 mg | DELAYED_RELEASE_TABLET | Freq: Two times a day (BID) | ORAL | 1 refills | Status: DC

## 2021-04-20 MED ORDER — AMLODIPINE BESYLATE 10 MG PO TABS: 10.0000 mg | ORAL_TABLET | Freq: Every day | ORAL | 1 refills | Status: DC

## 2021-04-20 MED ORDER — FENOFIBRATE 160 MG PO TABS: 160.0000 mg | ORAL_TABLET | Freq: Every day | ORAL | 1 refills | Status: DC

## 2021-04-20 MED ORDER — ATORVASTATIN CALCIUM 10 MG PO TABS: 10.0000 mg | ORAL_TABLET | Freq: Every day | ORAL | 1 refills | Status: DC

## 2021-04-20 NOTE — Patient Instructions (Addendum)
Schedule your mammogram please.  MyChart Login for [facility]: [facility]

## 2021-04-20 NOTE — Progress Notes (Signed)
Assessment & Plan:  1. Essential hypertension Well controlled on current regimen.  - amLODipine (NORVASC) 10 MG tablet; Take 1 tablet (10 mg total) by mouth daily.  Dispense: 90 tablet; Refill: 1 - hydrochlorothiazide (MICROZIDE) 12.5 MG capsule; Take 1 capsule (12.5 mg total) by mouth daily.  Dispense: 90 capsule; Refill: 1 - CBC with Differential/Platelet - CMP14+EGFR - Lipid panel  2. Mixed hyperlipidemia Well controlled on current regimen.  - atorvastatin (LIPITOR) 10 MG tablet; Take 1 tablet (10 mg total) by mouth daily.  Dispense: 90 tablet; Refill: 1 - fenofibrate 160 MG tablet; Take 1 tablet (160 mg total) by mouth daily.  Dispense: 90 tablet; Refill: 1 - CMP14+EGFR - Lipid panel  3. Stage 3a chronic kidney disease ([place]) Labs to assess. - CMP14+EGFR  4. Morbid obesity ([place]) Healthy eating and exercise encouraged. - CBC with Differential/Platelet - CMP14+EGFR - Lipid panel  5. Chronic allergic rhinitis Well controlled on current regimen.  - montelukast (SINGULAIR) 10 MG tablet; Take 1 tablet (10 mg total) by mouth at bedtime.  Dispense: 90 tablet; Refill: 1  6. Gastroesophageal reflux disease without esophagitis Well controlled on current regimen.  - CMP14+EGFR  7. Acute pain of left shoulder Shoulder exercises provided.  - diclofenac (VOLTAREN) 75 MG EC tablet; Take 1 tablet (75 mg total) by mouth 2 (two) times daily.  Dispense: 60 tablet; Refill: 1 - DG Shoulder Left   Return in about 6 weeks (around 06/01/2021) for shoulder pain AND 6 months for CPE.  Hendricks Limes, MSN, APRN, FNP-C [facility]  Subjective:    Patient ID: Jamie Clay, female    DOB: 08/24/1970, 51 y.o.   MRN: [id number]  Patient Care Team: Loman Brooklyn, FNP as PCP - General (Family Medicine)   Chief Complaint:  Chief Complaint  Patient presents with   Medical Management of Chronic Issues   Shoulder Pain    Left x 2 weeks     HPI: Jamie Clay is a  51 y.o. female presenting on 04/20/2021 for Medical Management of Chronic Issues and Shoulder Pain (Left x 2 weeks )  Hypertension: tolerating amlodipine and HCTZ. Patient does occasionally check her blood pressure at home and reports good readings.  Hyperlipidemia: tolerating Atorvastatin and fenofibrate.  The 10-year ASCVD risk score (Arnett DK, et al., 2019) is: 7.9%   Values used to calculate the score:     Age: 26 years     Sex: Female     Is Non-Hispanic African American: No     Diabetic: No     Tobacco smoker: Yes     Systolic Blood Pressure: 235 mmHg     Is BP treated: Yes     HDL Cholesterol: 32 mg/dL     Total Cholesterol: 165 mg/dL  GERD: controlled with Nexium.  Chronic allergic rhinitis: taking Allegra and Singulair.   New complaints: Patient complaints of left shoulder pain. The pain is described as aching.  The onset of the pain was  2 weeks ago .  The pain occurs continuously.  Location is acromioclavicular joint. No history of dislocation. Symptoms are aggravated by all activities. Symptoms are diminished by rest.  She has found no relief with Motrin, Tylenol, or Salonpas patches. Limited activities include: all activities.     Social history:  Relevant past medical, surgical, family and social history reviewed and updated as indicated. Interim medical history since our last visit reviewed.  Allergies and medications reviewed and updated.  DATA REVIEWED: CHART  IN EPIC  ROS: Negative unless specifically indicated above in HPI.    Current Outpatient Medications:    amLODipine (NORVASC) 10 MG tablet, Take 1 tablet (10 mg total) by mouth daily., Disp: 90 tablet, Rfl: 1   atorvastatin (LIPITOR) 10 MG tablet, Take 1 tablet (10 mg total) by mouth daily., Disp: 90 tablet, Rfl: 1   esomeprazole (NEXIUM) 40 MG capsule, Take 1 capsule (40 mg total) by mouth daily., Disp: 90 capsule, Rfl: 3   fenofibrate 160 MG tablet, Take 1 tablet (160 mg total) by mouth daily., Disp:  90 tablet, Rfl: 1   hydrochlorothiazide (MICROZIDE) 12.5 MG capsule, Take 1 capsule (12.5 mg total) by mouth daily., Disp: 90 capsule, Rfl: 1   montelukast (SINGULAIR) 10 MG tablet, Take 1 tablet (10 mg total) by mouth at bedtime., Disp: 90 tablet, Rfl: 1   fexofenadine (ALLEGRA) 30 MG tablet, Take 1 tablet by mouth daily., Disp: , Rfl:    Allergies  Allergen Reactions   Vancomycin Hives and Other (See Comments)   Past Medical History:  Diagnosis Date   Arthritis    Gallstones    GERD (gastroesophageal reflux disease)    HLD (hyperlipidemia)    Hypertension    Seasonal allergies     Past Surgical History:  Procedure Laterality Date   CARPOMETACARPEL SUSPENSION PLASTY Right 04/27/2014   Procedure: RIGHT THUMB CARPOMETACARPAL ARTHROPLASTY  [name] ;  Surgeon: Leanora Cover, MD;  Location: [place];  Service: Orthopedics;  Laterality: Right;   CESAREAN SECTION  02,95   CHOLECYSTECTOMY  1997   COLON SURGERY  08/01/2015   x6, one with appendectomy   LUMBAR DISC SURGERY  2009   LUMBAR FUSION  2011   PARTIAL HYMENECTOMY  2003   TONSILLECTOMY     TOTAL ABDOMINAL HYSTERECTOMY     TUBAL LIGATION      Social History   Socioeconomic History   Marital status: Married    Spouse name: Not on file   Number of children: 2   Years of education: Not on file   Highest education level: Not on file  Occupational History   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  Tobacco Use   Smoking status: Every Day    Packs/day: 0.25    Years: 25.00    Pack years: 6.25    Types: Cigarettes   Smokeless tobacco: Never  Substance and Sexual Activity   Alcohol use: No    Alcohol/week: 0.0 standard drinks   Drug use: No   Sexual activity: Yes    Birth control/protection: Surgical  Other Topics Concern   Not on file  Social History Narrative   Not on file   Social Determinants of Health   Financial Resource Strain: Not on file  Food Insecurity: Not on file  Transportation Needs: Not on  file  Physical Activity: Not on file  Stress: Not on file  Social Connections: Not on file  Intimate Partner Violence: Not on file        Objective:    BP 132/89    Pulse 99    Temp 98.1 F (36.7 C) (Temporal)    Ht 5' 7"  (1.702 m)    Wt 258 lb 6.4 oz (117.2 kg)    SpO2 97%    BMI 40.47 kg/m   Wt Readings from Last 3 Encounters:  04/20/21 258 lb 6.4 oz (117.2 kg)  12/30/20 257 lb 6 oz (116.7 kg)  12/07/20 257 lb (116.6 kg)    Physical Exam Vitals reviewed.  Constitutional:      General: She is not in acute distress.    Appearance: Normal appearance. She is not ill-appearing, toxic-appearing or diaphoretic.  HENT:     Head: Normocephalic and atraumatic.  Eyes:     General: No scleral icterus.       Right eye: No discharge.        Left eye: No discharge.     Conjunctiva/sclera: Conjunctivae normal.  Cardiovascular:     Rate and Rhythm: Normal rate and regular rhythm.     Heart sounds: Normal heart sounds. No murmur heard.   No friction rub. No gallop.  Pulmonary:     Effort: Pulmonary effort is normal. No respiratory distress.     Breath sounds: Normal breath sounds. No stridor. No wheezing, rhonchi or rales.  Musculoskeletal:     Left shoulder: Tenderness present. No swelling, deformity, effusion, laceration or crepitus. Decreased range of motion (unable to raise arm >90 degrees or put it behind her back). Normal pulse.     Cervical back: Normal range of motion.  Skin:    General: Skin is warm and dry.     Capillary Refill: Capillary refill takes less than 2 seconds.  Neurological:     General: No focal deficit present.     Mental Status: She is alert and oriented to person, place, and time. Mental status is at baseline.  Psychiatric:        Mood and Affect: Mood normal.        Behavior: Behavior normal.        Thought Content: Thought content normal.        Judgment: Judgment normal.    No results found for: TSH Lab Results  Component Value Date   WBC 7.4  10/18/2020   HGB 12.2 10/18/2020   HCT 38.1 10/18/2020   MCV 79 10/18/2020   PLT 375 10/18/2020   Lab Results  Component Value Date   NA 137 10/18/2020   K 4.1 10/18/2020   CO2 19 (L) 10/18/2020   GLUCOSE 108 (H) 10/18/2020   BUN 18 10/18/2020   CREATININE 1.10 (H) 10/18/2020   BILITOT 0.5 10/18/2020   ALKPHOS 40 (L) 10/18/2020   AST 18 10/18/2020   ALT 24 10/18/2020   PROT 6.6 10/18/2020   ALBUMIN 4.4 10/18/2020   CALCIUM 9.4 10/18/2020   ANIONGAP 14 06/19/2019   EGFR 61 10/18/2020   Lab Results  Component Value Date   CHOL 165 10/18/2020   Lab Results  Component Value Date   HDL 32 (L) 10/18/2020   Lab Results  Component Value Date   LDLCALC 106 (H) 10/18/2020   Lab Results  Component Value Date   TRIG 152 (H) 10/18/2020   Lab Results  Component Value Date   CHOLHDL 5.2 (H) 10/18/2020   No results found for: HGBA1C

## 2021-04-21 ENCOUNTER — Encounter: Payer: Self-pay | Admitting: Family Medicine

## 2021-04-21 DIAGNOSIS — E782 Mixed hyperlipidemia: Secondary | ICD-10-CM

## 2021-04-21 LAB — CBC WITH DIFFERENTIAL/PLATELET
Basophils Absolute: 0.1 10*3/uL (ref 0.0–0.2)
Basos: 1 %
EOS (ABSOLUTE): 0.2 10*3/uL (ref 0.0–0.4)
Eos: 3 %
Hematocrit: 39.2 % (ref 34.0–46.6)
Hemoglobin: 13.1 g/dL (ref 11.1–15.9)
Immature Grans (Abs): 0 10*3/uL (ref 0.0–0.1)
Immature Granulocytes: 0 %
Lymphocytes Absolute: 2.2 10*3/uL (ref 0.7–3.1)
Lymphs: 32 %
MCH: 26.4 pg — ABNORMAL LOW (ref 26.6–33.0)
MCHC: 33.4 g/dL (ref 31.5–35.7)
MCV: 79 fL (ref 79–97)
Monocytes Absolute: 0.4 10*3/uL (ref 0.1–0.9)
Monocytes: 6 %
Neutrophils Absolute: 4 10*3/uL (ref 1.4–7.0)
Neutrophils: 58 %
Platelets: 401 10*3/uL (ref 150–450)
RBC: 4.97 x10E6/uL (ref 3.77–5.28)
RDW: 14.4 % (ref 11.7–15.4)
WBC: 6.9 10*3/uL (ref 3.4–10.8)

## 2021-04-21 LAB — LIPID PANEL
Chol/HDL Ratio: 6.3 ratio — ABNORMAL HIGH (ref 0.0–4.4)
Cholesterol, Total: 175 mg/dL (ref 100–199)
HDL: 28 mg/dL — ABNORMAL LOW (ref >39)
LDL Chol Calc (NIH): 113 mg/dL — ABNORMAL HIGH (ref 0–99)
Triglycerides: 193 mg/dL — ABNORMAL HIGH (ref 0–149)
VLDL Cholesterol Cal: 34 mg/dL (ref 5–40)

## 2021-04-21 LAB — CMP14+EGFR
ALT: 29 IU/L (ref 0–32)
AST: 26 IU/L (ref 0–40)
Albumin/Globulin Ratio: 2.2 (ref 1.2–2.2)
Albumin: 4.7 g/dL (ref 3.8–4.9)
Alkaline Phosphatase: 46 IU/L (ref 44–121)
BUN/Creatinine Ratio: 15 (ref 9–23)
BUN: 20 mg/dL (ref 6–24)
Bilirubin Total: 0.4 mg/dL (ref 0.0–1.2)
CO2: 19 mmol/L — ABNORMAL LOW (ref 20–29)
Calcium: 9.8 mg/dL (ref 8.7–10.2)
Chloride: 106 mmol/L (ref 96–106)
Creatinine, Ser: 1.33 mg/dL — ABNORMAL HIGH (ref 0.57–1.00)
Globulin, Total: 2.1 g/dL (ref 1.5–4.5)
Glucose: 132 mg/dL — ABNORMAL HIGH (ref 70–99)
Potassium: 4 mmol/L (ref 3.5–5.2)
Sodium: 141 mmol/L (ref 134–144)
Total Protein: 6.8 g/dL (ref 6.0–8.5)
eGFR: 48 mL/min/{1.73_m2} — ABNORMAL LOW (ref >59)

## 2021-04-24 ENCOUNTER — Encounter: Payer: Self-pay | Admitting: Family Medicine

## 2021-04-24 DIAGNOSIS — E1165 Type 2 diabetes mellitus with hyperglycemia: Secondary | ICD-10-CM | POA: Insufficient documentation

## 2021-04-24 DIAGNOSIS — E119 Type 2 diabetes mellitus without complications: Secondary | ICD-10-CM

## 2021-04-24 HISTORY — DX: Type 2 diabetes mellitus without complications: E11.9

## 2021-04-24 LAB — HGB A1C W/O EAG: Hgb A1c MFr Bld: 6.5 % — ABNORMAL HIGH (ref 4.8–5.6)

## 2021-04-24 LAB — SPECIMEN STATUS REPORT

## 2021-04-24 MED ORDER — ATORVASTATIN CALCIUM 20 MG PO TABS: 20.0000 mg | ORAL_TABLET | Freq: Every day | ORAL | 1 refills | Status: DC

## 2021-05-10 ENCOUNTER — Encounter: Payer: Self-pay | Admitting: *Deleted

## 2021-05-11 LAB — HM MAMMOGRAPHY: HM Mammogram: NORMAL (ref 0–4)

## 2021-05-12 ENCOUNTER — Encounter: Payer: Self-pay | Admitting: Family Medicine

## 2021-06-01 ENCOUNTER — Encounter: Payer: Self-pay | Admitting: Family Medicine

## 2021-06-01 ENCOUNTER — Ambulatory Visit (INDEPENDENT_AMBULATORY_CARE_PROVIDER_SITE_OTHER): Payer: 59 | Admitting: Family Medicine

## 2021-06-01 VITALS — BP 128/88 | HR 86 | Temp 97.3°F | Ht 67.0 in | Wt 259.8 lb

## 2021-06-01 DIAGNOSIS — M25512 Pain in left shoulder: Secondary | ICD-10-CM

## 2021-06-01 NOTE — Patient Instructions (Signed)
Schedule a diabetic eye exam and have them fax [place] your results. ?

## 2021-06-01 NOTE — Progress Notes (Signed)
? ?Assessment & Plan:  ?1. Acute pain of left shoulder ?Not improving with physical therapy and conservative treatment. We will proceed with a MRI. ?- MR Shoulder Left Wo Contrast; Future ? ? ?Follow up plan: Return in about 2 months (around 08/01/2021) for DM. ? ?Deliah Boston, MSN, APRN, FNP-C ?[facility] ? ?Subjective:  ? ?Patient ID: Jamie Clay, female    DOB: January 18, 1971, 51 y.o.   MRN: [id number] ? ?HPI: ?Jamie Clay is a 51 y.o. female presenting on 06/01/2021 for Shoulder Pain (Left shoulder pain- 6 week follow up. Patient states some days are better but still having daily pain. ) ? ?Patient is here to follow-up on left shoulder pain. She has been doing physical therapy guided by me at home for the past six weeks. She reports some days are better than others, but she is still having daily pain. The pain is described as aching.  The onset of the pain was 8 weeks ago .  The pain occurs continuously.  Location is acromioclavicular joint. No history of dislocation. Symptoms are aggravated by all activities. Symptoms are diminished by rest.  She has found no relief with Motrin, Tylenol, or Salonpas patches. Limited activities include: all activities.  ? ? ?ROS: Negative unless specifically indicated above in HPI.  ? ?Relevant past medical history reviewed and updated as indicated.  ? ?Allergies and medications reviewed and updated. ? ? ?Current Outpatient Medications:  ?  amLODipine (NORVASC) 10 MG tablet, Take 1 tablet (10 mg total) by mouth daily., Disp: 90 tablet, Rfl: 1 ?  atorvastatin (LIPITOR) 20 MG tablet, Take 1 tablet (20 mg total) by mouth daily., Disp: 90 tablet, Rfl: 1 ?  diclofenac (VOLTAREN) 75 MG EC tablet, Take 1 tablet (75 mg total) by mouth 2 (two) times daily., Disp: 60 tablet, Rfl: 1 ?  esomeprazole (NEXIUM) 40 MG capsule, Take 1 capsule (40 mg total) by mouth daily., Disp: 90 capsule, Rfl: 3 ?  fenofibrate 160 MG tablet, Take 1 tablet (160 mg total) by mouth daily.,  Disp: 90 tablet, Rfl: 1 ?  hydrochlorothiazide (MICROZIDE) 12.5 MG capsule, Take 1 capsule (12.5 mg total) by mouth daily., Disp: 90 capsule, Rfl: 1 ?  montelukast (SINGULAIR) 10 MG tablet, Take 1 tablet (10 mg total) by mouth at bedtime., Disp: 90 tablet, Rfl: 1 ?  fexofenadine (ALLEGRA) 30 MG tablet, Take 1 tablet by mouth daily., Disp: , Rfl:  ? ?Allergies  ?Allergen Reactions  ? Vancomycin Hives and Other (See Comments)  ? ? ?Objective:  ? ?BP 128/88   Pulse 86   Temp (!) 97.3 ?F (36.3 ?C) (Temporal)   Ht 5\' 7"  (1.702 m)   Wt 259 lb 12.8 oz (117.8 kg)   SpO2 97%   BMI 40.69 kg/m?   ? ?Physical Exam ?Vitals reviewed.  ?Constitutional:   ?   General: She is not in acute distress. ?   Appearance: Normal appearance. She is not ill-appearing, toxic-appearing or diaphoretic.  ?HENT:  ?   Head: Normocephalic and atraumatic.  ?Eyes:  ?   General: No scleral icterus.    ?   Right eye: No discharge.     ?   Left eye: No discharge.  ?   Conjunctiva/sclera: Conjunctivae normal.  ?Cardiovascular:  ?   Rate and Rhythm: Normal rate and regular rhythm.  ?   Heart sounds: Normal heart sounds. No murmur heard. ?  No friction rub. No gallop.  ?Pulmonary:  ?   Effort: Pulmonary effort  is normal. No respiratory distress.  ?   Breath sounds: Normal breath sounds. No stridor. No wheezing, rhonchi or rales.  ?Musculoskeletal:  ?   Left shoulder: Tenderness present. No swelling, deformity, effusion, laceration or crepitus. Decreased range of motion (unable to raise arm >90 degrees or put it behind her back). Normal pulse.  ?   Cervical back: Normal range of motion.  ?Skin: ?   General: Skin is warm and dry.  ?   Capillary Refill: Capillary refill takes less than 2 seconds.  ?Neurological:  ?   General: No focal deficit present.  ?   Mental Status: She is alert and oriented to person, place, and time. Mental status is at baseline.  ?Psychiatric:     ?   Mood and Affect: Mood normal.     ?   Behavior: Behavior normal.     ?    Thought Content: Thought content normal.     ?   Judgment: Judgment normal.  ? ? ? ? ? ? ?

## 2021-06-19 ENCOUNTER — Encounter: Payer: Self-pay | Admitting: Family Medicine

## 2021-06-24 ENCOUNTER — Other Ambulatory Visit: Payer: Self-pay | Admitting: Family Medicine

## 2021-06-24 DIAGNOSIS — M25512 Pain in left shoulder: Secondary | ICD-10-CM

## 2021-07-08 ENCOUNTER — Ambulatory Visit
Admission: RE | Admit: 2021-07-08 | Discharge: 2021-07-08 | Disposition: A | Discharge status: Home / Self Care | Payer: 59 | Source: Ambulatory Visit | Attending: Family Medicine | Admitting: Family Medicine

## 2021-07-08 DIAGNOSIS — M25512 Pain in left shoulder: Secondary | ICD-10-CM

## 2021-07-08 DIAGNOSIS — M25412 Effusion, left shoulder: Secondary | ICD-10-CM | POA: Diagnosis not present

## 2021-07-08 DIAGNOSIS — M19012 Primary osteoarthritis, left shoulder: Secondary | ICD-10-CM | POA: Diagnosis not present

## 2021-07-11 ENCOUNTER — Encounter: Payer: Self-pay | Admitting: Family Medicine

## 2021-07-12 ENCOUNTER — Telehealth: Payer: Self-pay | Admitting: Family Medicine

## 2021-07-12 DIAGNOSIS — M25512 Pain in left shoulder: Secondary | ICD-10-CM

## 2021-07-12 NOTE — Telephone Encounter (Signed)
Referral placed patient aware  

## 2021-07-12 NOTE — Telephone Encounter (Signed)
Patient decided that she does want a referral to Ortho. She said that she discussed this with Britney.  ?

## 2021-07-12 NOTE — Telephone Encounter (Signed)
Okay to place referral due to shoulder pain. ?

## 2021-07-14 ENCOUNTER — Encounter: Payer: Self-pay | Admitting: Radiology

## 2021-07-19 ENCOUNTER — Ambulatory Visit: Payer: 59 | Admitting: Orthopedic Surgery

## 2021-07-19 ENCOUNTER — Encounter: Payer: Self-pay | Admitting: Orthopedic Surgery

## 2021-07-19 VITALS — BP 160/97 | HR 90 | Ht 68.0 in | Wt 256.0 lb

## 2021-07-19 DIAGNOSIS — M7502 Adhesive capsulitis of left shoulder: Secondary | ICD-10-CM | POA: Diagnosis not present

## 2021-07-19 NOTE — Patient Instructions (Addendum)
Referral for an ultrasound guided steroid injection ? ?After the injection, recommend you start working on your home exercises.  Additional exercises provided below. ? ?Medications as needed ? ?If the shoulder is not feeling better in 2 months, please call ? ?If you have any issues scheduling the appointment for your shoulder injection, please let us know.  ? ? ? ?Rotator Cuff Tear/Tendinitis Rehab  ? ?Ask your health care provider which exercises are safe for you. Do exercises exactly as told by your health care provider and adjust them as directed. It is normal to feel mild stretching, pulling, tightness, or discomfort as you do these exercises. Stop right away if you feel sudden pain or your pain gets worse. Do not begin these exercises until told by your health care provider. ?Stretching and range-of-motion exercises ? ?These exercises warm up your muscles and joints and improve the movement and flexibility of your shoulder. These exercises also help to relieve pain. ? ?Shoulder pendulum ?In this exercise, you let the injured arm dangle toward the floor and then swing it like a clock pendulum. ?Stand near a table or counter that you can hold onto for balance. ?Bend forward at the waist and let your left / right arm hang straight down. Use your other arm to support you and help you stay balanced. ?Relax your left / right arm and shoulder muscles, and move your hips and your trunk so your left / right arm swings freely. Your arm should swing because of the motion of your body, not because you are using your arm or shoulder muscles. ?Keep moving your hips and trunk so your arm swings in the following directions, as told by your health care provider: ?Side to side. ?Forward and backward. ?In clockwise and counterclockwise circles. ?Slowly return to the starting position. ?Repeat 10 times, or for 10 seconds per direction. Complete this exercise 2-3 times a day. ?  ?   ?Shoulder flexion, seated ?This exercise is  sometimes called table slides. In this exercise, you raise your arm in front of your body until you feel a stretch in your injured shoulder. ?Sit in a stable chair so your left / right forearm can rest on a flat surface. Your elbow should rest at a height that keeps your upper arm next to your body. ?Keeping your left / right shoulder relaxed, lean forward at the waist and let your hand slide forward (flexion). Stop when you feel a stretch in your shoulder, or when you reach the angle that is recommended by your health care provider. ?Hold for 5 seconds. ?Slowly return to the starting position. ?Repeat 10 times. Complete this exercise 1-2  times a day. ?      ?Shoulder flexion, standing ?In this exercise, you raise your arm in front of your body (flexion) until you feel a stretch in your injured shoulder. ?Stand and hold a broomstick, a cane, or a similar object. Place your hands a little more than shoulder-width apart on the object. Your left / right hand should be palm-up, and your other hand should be palm-down. ?Keep your elbow straight and your shoulder muscles relaxed. Push the stick up with your healthy arm to raise your left / right arm in front of your body, and then over your head until you feel a stretch in your shoulder. ?Avoid shrugging your shoulder while you raise your arm. Keep your shoulder blade tucked down toward the middle of your back. ?Keep your left / right shoulder muscles relaxed. ?Hold  for 10 seconds. ?Slowly return to the starting position. ?Repeat 10 times. Complete this exercise 1-2 times a day. ?  ?   ?Shoulder abduction, active-assisted ?You will need a stick, broom handle, or similar object to help you (assist) in doing this exercise. ?Lie on your back. This is the supine position. Hold a broomstick, a cane, or a similar object. ?Place your hands a little more than shoulder-width apart on the object. Your left / right hand should be palm-up, and your other hand should be  palm-down. ?Keeping your shoulder relaxed, push the stick to raise your left / right arm out to your side (abduction) and then over your head. Use your other hand to help move the stick. Stop when you feel a stretch in your shoulder, or when you reach the angle that is recommended by your health care provider. ?Avoid shrugging your shoulder while you raise your arm. Keep your shoulder blade tucked down toward the middle of your back. ?Hold for 10 seconds. ?Slowly return to the starting position. ?Repeat 10 times. Complete this exercise 1-2 times a day. ?  ?   ?Shoulder flexion, active-assisted ?Lie on your back. You may bend your knees for comfort. ?Hold a broomstick, a cane, or a similar object so that your hands are about shoulder-width apart. Your palms should face toward your feet. ?Raise your left / right arm over your head, then behind your head toward the floor (flexion). Use your other hand to help you do this (active-assisted). Stop when you feel a gentle stretch in your shoulder, or when you reach the angle that is recommended by your health care provider. ?Hold for 10 seconds. ?Use the stick and your other arm to help you return your left / right arm to the starting position. ?Repeat 10 times. Complete this exercise 1-2 times a day. ?  ?   ?External rotation ?Sit in a stable chair without armrests, or stand up. ?Tuck a soft object, such as a folded towel or a small ball, under your left / right upper arm. ?Hold a broomstick, a cane, or a similar object with your palms face-down, toward the floor. Bend your elbows to a 90-degree angle (right angle), and keep your hands about shoulder-width apart. ?Straighten your healthy arm and push the stick across your body, toward your left / right side. Keep your left / right arm bent. This will rotate your left / right forearm away from your body (external rotation). ?Hold for 10 seconds. ?Slowly return to the starting position. ?Repeat 10 times. Complete this  exercise 1-2 times a day.  ?   ? ? ? ?Strengthening exercises ?These exercises build strength and endurance in your shoulder. Endurance is the ability to use your muscles for a long time, even after they get tired. Do not start doing these exercises until your health care provider approves. ?Shoulder flexion, isometric ?Stand or sit in a doorway, facing the door frame. ?Keep your left / right arm straight and make a gentle fist with your hand. Place your fist against the door frame. Only your fist should be touching the frame. Keep your upper arm at your side. ?Gently press your fist against the door frame, as if you are trying to raise your arm above your head (isometric shoulder flexion). ?Avoid shrugging your shoulder while you press your hand into the door frame. Keep your shoulder blade tucked down toward the middle of your back. ?Hold for 10 seconds. ?Slowly release the tension, and relax your  muscles completely before you repeat the exercise. ?Repeat 10 times. Complete this exercise 3 times per week. ?     ?Shoulder abduction, isometric ?Stand or sit in a doorway. Your left / right arm should be closest to the door frame. ?Keep your left / right arm straight, and place the back of your hand against the door frame. Only your hand should be touching the frame. Keep the rest of your arm close to your side. ?Gently press the back of your hand against the door frame, as if you are trying to raise your arm out to the side (isometric shoulder abduction). ?Avoid shrugging your shoulder while you press your hand into the door frame. Keep your shoulder blade tucked down toward the middle of your back. ?Hold for 10 seconds. ?Slowly release the tension, and relax your muscles completely before you repeat the exercise. ?Repeat 10 times. Complete this exercise 3 times per week. ?  ?   ?Internal rotation, isometric ?This is an exercise in which you press your palm against a door frame without moving your shoulder joint  (isometric). ?Stand or sit in a doorway, facing the door frame. ?Bend your left / right elbow, and place the palm of your hand against the door frame. Only your palm should be touching the frame. Keep your upper arm at your side

## 2021-07-20 ENCOUNTER — Encounter: Payer: Self-pay | Admitting: Orthopedic Surgery

## 2021-07-20 NOTE — Progress Notes (Signed)
New Patient Visit ? ?Assessment: ?Jamie Clay is a 51 y.o. RHD female with the following: ?1. Adhesive capsulitis of left shoulder ? ?Plan: ?Jamie Clay has pain in the left shoulder, with restricted range of motion.  Atraumatic onset.  Radiographs are normal.  MRI of the shoulder without a tear, minimal degenerative changes.  Symptoms most consistent with adhesive capsulitis.  I think she will benefit from a high volume steroid injection.  We will refer her to Dr. Lucie Leather.  Follow up as needed.  ? ?Follow-up: ?Return if symptoms worsen or fail to improve. ? ?Subjective: ? ?Chief Complaint  ?Patient presents with  ? Shoulder Pain  ?  LT shoulder/ painful x 3 months/ no known injury  ? ? ?History of Present Illness: ?Jamie Clay is a 51 y.o. female who has been referred by  Deliah Boston, MD for evaluation of left shoulder pain.  She has had pain in her left shoulder for the past 3 months.  No injury.  Pain is worse at night.  She has difficulty with regular activities.  She has done some home exercises, but this is not helping.  NSAIDs have not improved her pain.  She has yet to have an injection.  No formal PT ? ? ?Review of Systems: ?No fevers or chills ?No numbness or tingling ?No chest pain ?No shortness of breath ?No bowel or bladder dysfunction ?No GI distress ?No headaches ? ? ?Medical History: ? ?Past Medical History:  ?Diagnosis Date  ? Arthritis   ? Diabetes (HCC) 04/24/2021  ? Gallstones   ? GERD (gastroesophageal reflux disease)   ? HLD (hyperlipidemia)   ? Hypertension   ? Seasonal allergies   ? ? ?Past Surgical History:  ?Procedure Laterality Date  ? CARPOMETACARPEL SUSPENSION PLASTY Right 04/27/2014  ? Procedure: RIGHT THUMB CARPOMETACARPAL ARTHROPLASTY  [name] ;  Surgeon: Betha Loa, MD;  Location: [facility];  Service: Orthopedics;  Laterality: Right;  ? CESAREAN SECTION  02,95  ? CHOLECYSTECTOMY  1997  ? COLON SURGERY  08/01/2015  ? x6, one with appendectomy  ?  LUMBAR DISC SURGERY  2009  ? LUMBAR FUSION  2011  ? PARTIAL HYMENECTOMY  2003  ? TONSILLECTOMY    ? TOTAL ABDOMINAL HYSTERECTOMY    ? TUBAL LIGATION    ? ? ?Family History  ?Problem Relation Age of Onset  ? Heart attack Father 52  ?     X2  ? Diabetes Father   ? Heart disease Father   ? Colon cancer Mother 45  ? Asthma Sister   ? Diabetes Sister   ? Heart disease Sister   ? Heart attack Paternal Grandfather   ? Lung cancer Maternal Grandfather   ? Other Paternal Grandmother   ?     stomach swelling  ? ?Social History  ? ?Tobacco Use  ? Smoking status: Every Day  ?  Packs/day: 0.25  ?  Years: 25.00  ?  Pack years: 6.25  ?  Types: Cigarettes  ? Smokeless tobacco: Never  ?Substance Use Topics  ? Alcohol use: No  ?  Alcohol/week: 0.0 standard drinks  ? Drug use: No  ? ? ?Allergies  ?Allergen Reactions  ? Vancomycin Hives and Other (See Comments)  ? ? ?Current Meds  ?Medication Sig  ? amLODipine (NORVASC) 10 MG tablet Take 1 tablet (10 mg total) by mouth daily.  ? atorvastatin (LIPITOR) 20 MG tablet Take 1 tablet (20 mg total) by mouth daily.  ? diclofenac (  VOLTAREN) 75 MG EC tablet TAKE 1 TABLET BY MOUTH TWICE A DAY  ? esomeprazole (NEXIUM) 40 MG capsule Take 1 capsule (40 mg total) by mouth daily.  ? fenofibrate 160 MG tablet Take 1 tablet (160 mg total) by mouth daily.  ? fexofenadine (ALLEGRA) 30 MG tablet Take 1 tablet by mouth daily.  ? hydrochlorothiazide (MICROZIDE) 12.5 MG capsule Take 1 capsule (12.5 mg total) by mouth daily.  ? montelukast (SINGULAIR) 10 MG tablet Take 1 tablet (10 mg total) by mouth at bedtime.  ? ? ?Objective: ?BP (!) 160/97   Pulse 90   Ht 5\' 8"  (1.727 m)   Wt 256 lb (116.1 kg)   BMI 38.92 kg/m?  ? ?Physical Exam: ? ?General: Alert and oriented. and No acute distress. ?Gait: Normal gait. ? ?Left shoulder without deformity.  FF to 100 before painful.  Passively, she can go beyond this, but is in discomfort.  IR to her side.  ER lacking 25-30 degrees compared to RUE.  Fingers are warm and  well perfused.  ? ?IMAGING: ?I personally reviewed images previously obtained in clinic ? ?Left Shoulder MRI ? ?IMPRESSION:INTACT ROTATOR CUFF. ?1. Minimal degenerative changes of the acromioclavicular joint ?including minimal peripheral degenerative osteophytes and a small ?joint effusion. ?2. Mild thinning of the glenoid and humeral head cartilage. ? ? ? ?New Medications:  ?No orders of the defined types were placed in this encounter. ? ? ? ? ? , MD ? ?07/20/2021 ?8:45 AM ? ? ?

## 2021-07-27 DIAGNOSIS — M25512 Pain in left shoulder: Secondary | ICD-10-CM | POA: Diagnosis not present

## 2021-08-08 ENCOUNTER — Ambulatory Visit: Payer: 59 | Admitting: Family Medicine

## 2021-08-14 ENCOUNTER — Ambulatory Visit: Payer: 59 | Admitting: Family Medicine

## 2021-08-14 ENCOUNTER — Encounter: Payer: Self-pay | Admitting: Family Medicine

## 2021-08-14 VITALS — BP 131/85 | HR 91 | Temp 97.9°F | Ht 68.0 in | Wt 255.8 lb

## 2021-08-14 DIAGNOSIS — N1831 Chronic kidney disease, stage 3a: Secondary | ICD-10-CM | POA: Diagnosis not present

## 2021-08-14 DIAGNOSIS — I10 Essential (primary) hypertension: Secondary | ICD-10-CM | POA: Diagnosis not present

## 2021-08-14 DIAGNOSIS — K219 Gastro-esophageal reflux disease without esophagitis: Secondary | ICD-10-CM | POA: Diagnosis not present

## 2021-08-14 DIAGNOSIS — E1165 Type 2 diabetes mellitus with hyperglycemia: Secondary | ICD-10-CM

## 2021-08-14 DIAGNOSIS — J309 Allergic rhinitis, unspecified: Secondary | ICD-10-CM | POA: Diagnosis not present

## 2021-08-14 DIAGNOSIS — E782 Mixed hyperlipidemia: Secondary | ICD-10-CM

## 2021-08-14 DIAGNOSIS — E1169 Type 2 diabetes mellitus with other specified complication: Secondary | ICD-10-CM | POA: Diagnosis not present

## 2021-08-14 DIAGNOSIS — M1990 Unspecified osteoarthritis, unspecified site: Secondary | ICD-10-CM

## 2021-08-14 LAB — BAYER DCA HB A1C WAIVED: HB A1C (BAYER DCA - WAIVED): 5.8 % — ABNORMAL HIGH (ref 4.8–5.6)

## 2021-08-14 MED ORDER — DICLOFENAC SODIUM 75 MG PO TBEC: 75.0000 mg | DELAYED_RELEASE_TABLET | Freq: Two times a day (BID) | ORAL | 1 refills | Status: AC

## 2021-08-14 NOTE — Progress Notes (Signed)
Assessment & Plan:  1. Type 2 diabetes mellitus with hyperglycemia, without long-term current use of insulin (HCC) Lab Results  Component Value Date   HGBA1C 5.8 (H) 08/14/2021   HGBA1C 6.5 (H) 04/20/2021    - Diabetes is at goal of A1c < 7. - Medications:  not needed at this time - Patient is currently taking a statin. Patient is not taking an ACE-inhibitor/ARB.  - Instruction/counseling given: reminded to get eye exam, discussed foot care, and discussed diet  Diabetes Health Maintenance Due  Topic Date Due   OPHTHALMOLOGY EXAM  Never done   URINE MICROALBUMIN  Never done   HEMOGLOBIN A1C  02/14/2022   FOOT EXAM  08/15/2022    No results found for: LABMICR, MICROALBUR - Lipid panel - CBC with Differential/Platelet - CMP14+EGFR - Bayer DCA Hb A1c Waived - Microalbumin / creatinine urine ratio  2. Mixed hyperlipidemia Well controlled on current regimen.  - Lipid panel - CBC with Differential/Platelet - CMP14+EGFR  3. Essential hypertension Well controlled on current regimen.  - Lipid panel - CBC with Differential/Platelet - CMP14+EGFR  4. Morbid obesity ([place]) Continue healthy eating and exercise. - Lipid panel - CBC with Differential/Platelet - CMP14+EGFR  5. Stage 3a chronic kidney disease ([place]) Labs to assess. - CMP14+EGFR  6. Gastroesophageal reflux disease without esophagitis Well controlled on current regimen.  - CMP14+EGFR  7. Chronic allergic rhinitis Well controlled on current regimen.  - CMP14+EGFR  8. Arthritis Well controlled on current regimen.  - CMP14+EGFR - diclofenac (VOLTAREN) 75 MG EC tablet; Take 1 tablet (75 mg total) by mouth 2 (two) times daily.  Dispense: 180 tablet; Refill: 1   Return in about 6 months (around 02/14/2022) for annual physical.  Jamie Limes, MSN, APRN, FNP-C [facility]  Subjective:    Patient ID: Jamie Clay, female    DOB: Aug 07, 1970, 51 y.o.   MRN: [id number]  Patient Care  Team: Jamie Brooklyn, FNP as PCP - General (Family Medicine)   Chief Complaint:  Chief Complaint  Patient presents with   Diabetes    2 month follow up    HPI: Jamie Clay is a 51 y.o. female presenting on 08/14/2021 for Diabetes (2 month follow up)  Diabetes: Patient presents for follow up of diabetes. Current symptoms include: hyperglycemia. Known diabetic complications: none. Medication compliance: none. Current diet: in general, a "healthy" diet  . Current exercise: walking. Home blood sugar records: patient does not check sugars. Is she  on ACE inhibitor or angiotensin II receptor blocker? No. Is she on a statin? Yes (Atorvastatin).   Hypertension: tolerating amlodipine and HCTZ. Patient does occasionally check her blood pressure at home and reports good readings.  Hyperlipidemia: tolerating Atorvastatin and fenofibrate.  The 10-year ASCVD risk score (Arnett DK, et al., 2019) is: 18.4%   Values used to calculate the score:     Age: 31 years     Sex: Female     Is Non-Hispanic African American: No     Diabetic: Yes     Tobacco smoker: Yes     Systolic Blood Pressure: 144 mmHg     Is BP treated: Yes     HDL Cholesterol: 28 mg/dL     Total Cholesterol: 175 mg/dL  GERD: controlled with Nexium.  Chronic allergic rhinitis: taking Allegra and Singulair.   New complaints: None   Social history:  Relevant past medical, surgical, family and social history reviewed and updated as indicated. Interim medical history since  our last visit reviewed.  Allergies and medications reviewed and updated.  DATA REVIEWED: CHART IN EPIC  ROS: Negative unless specifically indicated above in HPI.    Current Outpatient Medications:    amLODipine (NORVASC) 10 MG tablet, Take 1 tablet (10 mg total) by mouth daily., Disp: 90 tablet, Rfl: 1   atorvastatin (LIPITOR) 20 MG tablet, Take 1 tablet (20 mg total) by mouth daily., Disp: 90 tablet, Rfl: 1   diclofenac (VOLTAREN) 75 MG EC tablet,  TAKE 1 TABLET BY MOUTH TWICE A DAY, Disp: 60 tablet, Rfl: 1   esomeprazole (NEXIUM) 40 MG capsule, Take 1 capsule (40 mg total) by mouth daily., Disp: 90 capsule, Rfl: 3   fenofibrate 160 MG tablet, Take 1 tablet (160 mg total) by mouth daily., Disp: 90 tablet, Rfl: 1   hydrochlorothiazide (MICROZIDE) 12.5 MG capsule, Take 1 capsule (12.5 mg total) by mouth daily., Disp: 90 capsule, Rfl: 1   montelukast (SINGULAIR) 10 MG tablet, Take 1 tablet (10 mg total) by mouth at bedtime., Disp: 90 tablet, Rfl: 1   fexofenadine (ALLEGRA) 30 MG tablet, Take 1 tablet by mouth daily., Disp: , Rfl:    Allergies  Allergen Reactions   Vancomycin Hives and Other (See Comments)   Past Medical History:  Diagnosis Date   Arthritis    Diabetes ([place]) 04/24/2021   Gallstones    GERD (gastroesophageal reflux disease)    HLD (hyperlipidemia)    Hypertension    Seasonal allergies     Past Surgical History:  Procedure Laterality Date   CARPOMETACARPEL SUSPENSION PLASTY Right 04/27/2014   Procedure: RIGHT THUMB CARPOMETACARPAL ARTHROPLASTY  Jamie Clay ;  Surgeon: Leanora Cover, MD;  Location: [facility];  Service: Orthopedics;  Laterality: Right;   CESAREAN SECTION  02,95   CHOLECYSTECTOMY  1997   COLON SURGERY  08/01/2015   x6, one with appendectomy   LUMBAR DISC SURGERY  2009   LUMBAR FUSION  2011   PARTIAL HYMENECTOMY  2003   TONSILLECTOMY     TOTAL ABDOMINAL HYSTERECTOMY     TUBAL LIGATION      Social History   Socioeconomic History   Marital status: Married    Spouse name: Not on file   Number of children: 2   Years of education: Not on file   Highest education level: Not on file  Occupational History   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  Tobacco Use   Smoking status: Every Day    Packs/day: 0.25    Years: 25.00    Pack years: 6.25    Types: Cigarettes   Smokeless tobacco: Never  Substance and Sexual Activity   Alcohol use: No    Alcohol/week: 0.0 standard drinks   Drug use: No    Sexual activity: Yes    Birth control/protection: Surgical  Other Topics Concern   Not on file  Social History Narrative   Not on file   Social Determinants of Health   Financial Resource Strain: Not on file  Food Insecurity: Not on file  Transportation Needs: Not on file  Physical Activity: Not on file  Stress: Not on file  Social Connections: Not on file  Intimate Partner Violence: Not on file        Objective:    BP 131/85   Pulse 91   Temp 97.9 F (36.6 C) (Temporal)   Ht 5' 8"  (1.727 m)   Wt 255 lb 12.8 oz (116 kg)   SpO2 96%   BMI 38.89 kg/m  Wt Readings from Last 3 Encounters:  08/14/21 255 lb 12.8 oz (116 kg)  07/19/21 256 lb (116.1 kg)  06/01/21 259 lb 12.8 oz (117.8 kg)    Physical Exam Vitals reviewed.  Constitutional:      General: She is not in acute distress.    Appearance: Normal appearance. She is morbidly obese. She is not ill-appearing, toxic-appearing or diaphoretic.  HENT:     Head: Normocephalic and atraumatic.  Eyes:     General: No scleral icterus.       Right eye: No discharge.        Left eye: No discharge.     Conjunctiva/sclera: Conjunctivae normal.  Cardiovascular:     Rate and Rhythm: Normal rate and regular rhythm.     Heart sounds: Normal heart sounds. No murmur heard.   No friction rub. No gallop.  Pulmonary:     Effort: Pulmonary effort is normal. No respiratory distress.     Breath sounds: Normal breath sounds. No stridor. No wheezing, rhonchi or rales.  Musculoskeletal:        General: Normal range of motion.     Cervical back: Normal range of motion.  Skin:    General: Skin is warm and dry.     Capillary Refill: Capillary refill takes less than 2 seconds.  Neurological:     General: No focal deficit present.     Mental Status: She is alert and oriented to person, place, and time. Mental status is at baseline.  Psychiatric:        Mood and Affect: Mood normal.        Behavior: Behavior normal.        Thought  Content: Thought content normal.        Judgment: Judgment normal.   Diabetic Foot Exam - Simple   Simple Foot Form Diabetic Foot exam was performed with the following findings: Yes 08/14/2021  8:31 AM  Visual Inspection No deformities, no ulcerations, no other skin breakdown bilaterally: Yes Sensation Testing Intact to touch and monofilament testing bilaterally: Yes Pulse Check Posterior Tibialis and Dorsalis pulse intact bilaterally: Yes Comments     No results found for: TSH Lab Results  Component Value Date   WBC 6.9 04/20/2021   HGB 13.1 04/20/2021   HCT 39.2 04/20/2021   MCV 79 04/20/2021   PLT 401 04/20/2021   Lab Results  Component Value Date   NA 141 04/20/2021   K 4.0 04/20/2021   CO2 19 (L) 04/20/2021   GLUCOSE 132 (H) 04/20/2021   BUN 20 04/20/2021   CREATININE 1.33 (H) 04/20/2021   BILITOT 0.4 04/20/2021   ALKPHOS 46 04/20/2021   AST 26 04/20/2021   ALT 29 04/20/2021   PROT 6.8 04/20/2021   ALBUMIN 4.7 04/20/2021   CALCIUM 9.8 04/20/2021   ANIONGAP 14 06/19/2019   EGFR 48 (L) 04/20/2021   Lab Results  Component Value Date   CHOL 175 04/20/2021   Lab Results  Component Value Date   HDL 28 (L) 04/20/2021   Lab Results  Component Value Date   LDLCALC 113 (H) 04/20/2021   Lab Results  Component Value Date   TRIG 193 (H) 04/20/2021   Lab Results  Component Value Date   CHOLHDL 6.3 (H) 04/20/2021   Lab Results  Component Value Date   HGBA1C 6.5 (H) 04/20/2021

## 2021-08-14 NOTE — Patient Instructions (Signed)
Please schedule your eye exam and ask them to fax [place] the results.

## 2021-08-15 LAB — CBC WITH DIFFERENTIAL/PLATELET
Basophils Absolute: 0.1 10*3/uL (ref 0.0–0.2)
Basos: 1 %
EOS (ABSOLUTE): 0.2 10*3/uL (ref 0.0–0.4)
Eos: 2 %
Hematocrit: 37.8 % (ref 34.0–46.6)
Hemoglobin: 12.1 g/dL (ref 11.1–15.9)
Immature Grans (Abs): 0 10*3/uL (ref 0.0–0.1)
Immature Granulocytes: 0 %
Lymphocytes Absolute: 3.2 10*3/uL — ABNORMAL HIGH (ref 0.7–3.1)
Lymphs: 36 %
MCH: 25.6 pg — ABNORMAL LOW (ref 26.6–33.0)
MCHC: 32 g/dL (ref 31.5–35.7)
MCV: 80 fL (ref 79–97)
Monocytes Absolute: 0.5 10*3/uL (ref 0.1–0.9)
Monocytes: 6 %
Neutrophils Absolute: 4.9 10*3/uL (ref 1.4–7.0)
Neutrophils: 55 %
Platelets: 386 10*3/uL (ref 150–450)
RBC: 4.73 x10E6/uL (ref 3.77–5.28)
RDW: 14.7 % (ref 11.7–15.4)
WBC: 8.8 10*3/uL (ref 3.4–10.8)

## 2021-08-15 LAB — CMP14+EGFR
ALT: 22 IU/L (ref 0–32)
AST: 11 IU/L (ref 0–40)
Albumin/Globulin Ratio: 2 (ref 1.2–2.2)
Albumin: 4.2 g/dL (ref 3.8–4.9)
Alkaline Phosphatase: 40 IU/L — ABNORMAL LOW (ref 44–121)
BUN/Creatinine Ratio: 19 (ref 9–23)
BUN: 26 mg/dL — ABNORMAL HIGH (ref 6–24)
Bilirubin Total: 0.3 mg/dL (ref 0.0–1.2)
CO2: 22 mmol/L (ref 20–29)
Calcium: 9.7 mg/dL (ref 8.7–10.2)
Chloride: 107 mmol/L — ABNORMAL HIGH (ref 96–106)
Creatinine, Ser: 1.38 mg/dL — ABNORMAL HIGH (ref 0.57–1.00)
Globulin, Total: 2.1 g/dL (ref 1.5–4.5)
Glucose: 110 mg/dL — ABNORMAL HIGH (ref 70–99)
Potassium: 4.2 mmol/L (ref 3.5–5.2)
Sodium: 142 mmol/L (ref 134–144)
Total Protein: 6.3 g/dL (ref 6.0–8.5)
eGFR: 46 mL/min/{1.73_m2} — ABNORMAL LOW (ref >59)

## 2021-08-15 LAB — LIPID PANEL
Chol/HDL Ratio: 4 ratio (ref 0.0–4.4)
Cholesterol, Total: 144 mg/dL (ref 100–199)
HDL: 36 mg/dL — ABNORMAL LOW (ref >39)
LDL Chol Calc (NIH): 89 mg/dL (ref 0–99)
Triglycerides: 100 mg/dL (ref 0–149)
VLDL Cholesterol Cal: 19 mg/dL (ref 5–40)

## 2021-08-15 LAB — MICROALBUMIN / CREATININE URINE RATIO
Creatinine, Urine: 166.9 mg/dL
Microalb/Creat Ratio: 13 mg/g creat (ref 0–29)
Microalbumin, Urine: 21.5 ug/mL

## 2021-08-16 ENCOUNTER — Encounter: Payer: Self-pay | Admitting: Family Medicine

## 2021-10-20 ENCOUNTER — Encounter: Payer: 59 | Admitting: Family Medicine

## 2021-10-27 ENCOUNTER — Encounter: Payer: 59 | Admitting: Family Medicine

## 2021-10-31 NOTE — Progress Notes (Signed)
No Show

## 2021-11-01 ENCOUNTER — Other Ambulatory Visit: Payer: Self-pay | Admitting: Family Medicine

## 2021-11-01 ENCOUNTER — Encounter: Payer: Self-pay | Admitting: Family Medicine

## 2021-11-01 DIAGNOSIS — J309 Allergic rhinitis, unspecified: Secondary | ICD-10-CM

## 2021-11-05 ENCOUNTER — Other Ambulatory Visit: Payer: Self-pay | Admitting: Family Medicine

## 2021-11-05 DIAGNOSIS — I10 Essential (primary) hypertension: Secondary | ICD-10-CM

## 2021-11-05 DIAGNOSIS — E782 Mixed hyperlipidemia: Secondary | ICD-10-CM

## 2021-11-06 NOTE — Telephone Encounter (Signed)
NTBS for 6 mos FU in Nov w/ new provider. Jamie Clay pt. RFs sent

## 2022-02-08 ENCOUNTER — Other Ambulatory Visit: Payer: Self-pay | Admitting: Family Medicine

## 2022-02-08 DIAGNOSIS — E782 Mixed hyperlipidemia: Secondary | ICD-10-CM

## 2022-02-08 DIAGNOSIS — I10 Essential (primary) hypertension: Secondary | ICD-10-CM

## 2022-03-08 ENCOUNTER — Other Ambulatory Visit: Payer: Self-pay | Admitting: Family Medicine

## 2022-03-08 DIAGNOSIS — I10 Essential (primary) hypertension: Secondary | ICD-10-CM

## 2022-03-08 DIAGNOSIS — E782 Mixed hyperlipidemia: Secondary | ICD-10-CM

## 2022-03-17 ENCOUNTER — Other Ambulatory Visit: Payer: Self-pay | Admitting: Family Medicine

## 2022-03-17 DIAGNOSIS — E782 Mixed hyperlipidemia: Secondary | ICD-10-CM

## 2022-03-20 ENCOUNTER — Encounter: Payer: Self-pay | Admitting: Family Medicine

## 2022-03-20 NOTE — Telephone Encounter (Signed)
LMTCB to make an appt w/[facility] or Lequita Halt ([facility] pt) to get refills on her med & also sent letter to pt.

## 2022-03-20 NOTE — Telephone Encounter (Signed)
Refill denied, pt needs appt for further refills.

## 2022-05-11 ENCOUNTER — Other Ambulatory Visit: Payer: Self-pay | Admitting: Family Medicine

## 2022-05-11 DIAGNOSIS — J309 Allergic rhinitis, unspecified: Secondary | ICD-10-CM

## 2022-05-11 NOTE — Telephone Encounter (Signed)
Jamie Clay pt NTBS by new provider. 30 days sent to pharmacy

## 2022-05-11 NOTE — Telephone Encounter (Signed)
Called to schedule appt with new PCP, pt stated she has new insurance and we are no longer in network for her and she is looking for a new PCP

## 2022-05-24 ENCOUNTER — Other Ambulatory Visit: Payer: Self-pay | Admitting: Family Medicine

## 2022-05-24 ENCOUNTER — Encounter: Payer: Self-pay | Admitting: Radiology

## 2022-05-24 DIAGNOSIS — J309 Allergic rhinitis, unspecified: Secondary | ICD-10-CM
